# Patient Record
Sex: Male | Born: 1950 | Race: White | Hispanic: No | Marital: Married | Smoking: Never smoker
Health system: Southern US, Community
[De-identification: ages and names within clinical notes are randomized; demographics above are authoritative.]

## PROBLEM LIST (undated history)

## (undated) DIAGNOSIS — S149XXA Injury of unspecified nerves of neck, initial encounter: Secondary | ICD-10-CM

## (undated) DIAGNOSIS — M1990 Unspecified osteoarthritis, unspecified site: Secondary | ICD-10-CM

## (undated) DIAGNOSIS — I493 Ventricular premature depolarization: Secondary | ICD-10-CM

## (undated) DIAGNOSIS — C61 Malignant neoplasm of prostate: Secondary | ICD-10-CM

## (undated) DIAGNOSIS — E7800 Pure hypercholesterolemia, unspecified: Secondary | ICD-10-CM

## (undated) DIAGNOSIS — G4733 Obstructive sleep apnea (adult) (pediatric): Secondary | ICD-10-CM

## (undated) DIAGNOSIS — G589 Mononeuropathy, unspecified: Secondary | ICD-10-CM

## (undated) DIAGNOSIS — I6529 Occlusion and stenosis of unspecified carotid artery: Secondary | ICD-10-CM

## (undated) DIAGNOSIS — I2510 Atherosclerotic heart disease of native coronary artery without angina pectoris: Secondary | ICD-10-CM

## (undated) DIAGNOSIS — Z9289 Personal history of other medical treatment: Secondary | ICD-10-CM

## (undated) DIAGNOSIS — J302 Other seasonal allergic rhinitis: Secondary | ICD-10-CM

## (undated) HISTORY — PX: ANGIOPLASTY: SHX39

## (undated) HISTORY — DX: Other seasonal allergic rhinitis: J30.2

## (undated) HISTORY — DX: Ventricular premature depolarization: I49.3

## (undated) HISTORY — PX: CARDIAC CATHETERIZATION: SHX172

## (undated) HISTORY — DX: Personal history of other medical treatment: Z92.89

## (undated) HISTORY — DX: Obstructive sleep apnea (adult) (pediatric): G47.33

## (undated) HISTORY — DX: Occlusion and stenosis of unspecified carotid artery: I65.29

## (undated) HISTORY — DX: Malignant neoplasm of prostate: C61

---

## 2000-07-05 ENCOUNTER — Encounter: Payer: Self-pay | Admitting: Family Medicine

## 2000-07-05 ENCOUNTER — Encounter: Admission: RE | Admit: 2000-07-05 | Discharge: 2000-07-05 | Payer: Self-pay | Admitting: Family Medicine

## 2002-09-21 ENCOUNTER — Encounter (INDEPENDENT_AMBULATORY_CARE_PROVIDER_SITE_OTHER): Payer: Self-pay

## 2002-09-21 ENCOUNTER — Ambulatory Visit (HOSPITAL_COMMUNITY): Admission: RE | Admit: 2002-09-21 | Discharge: 2002-09-21 | Payer: Self-pay | Admitting: Gastroenterology

## 2003-04-07 ENCOUNTER — Encounter: Payer: Self-pay | Admitting: Cardiology

## 2003-04-07 ENCOUNTER — Inpatient Hospital Stay (HOSPITAL_COMMUNITY): Admission: EM | Admit: 2003-04-07 | Discharge: 2003-04-08 | Payer: Self-pay | Admitting: Emergency Medicine

## 2003-04-10 ENCOUNTER — Inpatient Hospital Stay (HOSPITAL_COMMUNITY): Admission: EM | Admit: 2003-04-10 | Discharge: 2003-04-11 | Payer: Self-pay | Admitting: Emergency Medicine

## 2004-01-11 ENCOUNTER — Encounter: Admission: RE | Admit: 2004-01-11 | Discharge: 2004-01-11 | Payer: Self-pay | Admitting: Cardiology

## 2004-01-12 ENCOUNTER — Inpatient Hospital Stay (HOSPITAL_COMMUNITY): Admission: EM | Admit: 2004-01-12 | Discharge: 2004-01-13 | Payer: Self-pay | Admitting: Emergency Medicine

## 2004-02-01 ENCOUNTER — Ambulatory Visit (HOSPITAL_COMMUNITY): Admission: RE | Admit: 2004-02-01 | Discharge: 2004-02-01 | Payer: Self-pay | Admitting: Cardiology

## 2004-05-29 ENCOUNTER — Encounter: Admission: RE | Admit: 2004-05-29 | Discharge: 2004-05-29 | Payer: Self-pay | Admitting: Family Medicine

## 2004-07-07 ENCOUNTER — Ambulatory Visit (HOSPITAL_COMMUNITY): Admission: RE | Admit: 2004-07-07 | Discharge: 2004-07-07 | Payer: Self-pay | Admitting: Urology

## 2004-07-12 ENCOUNTER — Ambulatory Visit (HOSPITAL_COMMUNITY): Admission: RE | Admit: 2004-07-12 | Discharge: 2004-07-12 | Payer: Self-pay | Admitting: Urology

## 2004-07-25 ENCOUNTER — Ambulatory Visit: Admission: RE | Admit: 2004-07-25 | Discharge: 2004-08-25 | Payer: Self-pay | Admitting: Radiation Oncology

## 2004-08-16 HISTORY — PX: PROSTATECTOMY: SHX69

## 2004-08-23 ENCOUNTER — Inpatient Hospital Stay (HOSPITAL_COMMUNITY): Admission: RE | Admit: 2004-08-23 | Discharge: 2004-08-25 | Payer: Self-pay | Admitting: Urology

## 2004-08-23 ENCOUNTER — Encounter (INDEPENDENT_AMBULATORY_CARE_PROVIDER_SITE_OTHER): Payer: Self-pay | Admitting: Specialist

## 2009-09-20 ENCOUNTER — Inpatient Hospital Stay (HOSPITAL_COMMUNITY): Admission: EM | Admit: 2009-09-20 | Discharge: 2009-09-22 | Payer: Self-pay | Admitting: Emergency Medicine

## 2010-10-08 LAB — POCT CARDIAC MARKERS
Myoglobin, poc: 147 ng/mL (ref 12–200)
Troponin i, poc: <0.05 ng/mL (ref 0.00–0.09)

## 2010-10-08 LAB — COMPREHENSIVE METABOLIC PANEL
Albumin: 4.1 g/dL (ref 3.5–5.2)
Alkaline Phosphatase: 58 U/L (ref 39–117)
BUN: 11 mg/dL (ref 6–23)
CO2: 27 mEq/L (ref 19–32)
Calcium: 9.2 mg/dL (ref 8.4–10.5)
Chloride: 102 mEq/L (ref 96–112)
GFR calc Af Amer: >60 mL/min (ref >60)
GFR calc non Af Amer: >60 mL/min (ref >60)
Glucose, Bld: 167 mg/dL — ABNORMAL HIGH (ref 70–99)
Sodium: 139 mEq/L (ref 135–145)
Total Bilirubin: 0.7 mg/dL (ref 0.3–1.2)

## 2010-10-08 LAB — CBC
HCT: 40.8 % (ref 39.0–52.0)
Hemoglobin: 13.8 g/dL (ref 13.0–17.0)
MCHC: 35.2 g/dL (ref 30.0–36.0)
MCHC: 35.3 g/dL (ref 30.0–36.0)
MCHC: 35.6 g/dL (ref 30.0–36.0)
MCV: 90.2 fL (ref 78.0–100.0)
MCV: 91.9 fL (ref 78.0–100.0)
Platelets: 227 10*3/uL (ref 150–400)
RBC: 4.27 MIL/uL (ref 4.22–5.81)
RDW: 13.4 % (ref 11.5–15.5)
WBC: 7.3 10*3/uL (ref 4.0–10.5)
WBC: 8.1 10*3/uL (ref 4.0–10.5)

## 2010-10-08 LAB — CARDIAC PANEL(CRET KIN+CKTOT+MB+TROPI)
CK, MB: 1.1 ng/mL (ref 0.3–4.0)
CK, MB: 1.5 ng/mL (ref 0.3–4.0)
Relative Index: 0.4 (ref 0.0–2.5)
Relative Index: 0.5 (ref 0.0–2.5)
Troponin I: 0.01 ng/mL (ref 0.00–0.06)

## 2010-10-08 LAB — PROTIME-INR
INR: 0.97 (ref 0.00–1.49)
Prothrombin Time: 13.1 seconds (ref 11.6–15.2)
Prothrombin Time: 13.1 seconds (ref 11.6–15.2)

## 2010-10-08 LAB — URINALYSIS, ROUTINE W REFLEX MICROSCOPIC
Bilirubin Urine: NEGATIVE
Hgb urine dipstick: NEGATIVE
Specific Gravity, Urine: 1.009 (ref 1.005–1.030)

## 2010-10-08 LAB — MAGNESIUM: Magnesium: 2.2 mg/dL (ref 1.5–2.5)

## 2010-10-08 LAB — BASIC METABOLIC PANEL
BUN: 12 mg/dL (ref 6–23)
BUN: 13 mg/dL (ref 6–23)
Calcium: 8.5 mg/dL (ref 8.4–10.5)
Calcium: 8.7 mg/dL (ref 8.4–10.5)
GFR calc Af Amer: >60 mL/min (ref >60)
GFR calc non Af Amer: >60 mL/min (ref >60)
Glucose, Bld: 102 mg/dL — ABNORMAL HIGH (ref 70–99)
Glucose, Bld: 106 mg/dL — ABNORMAL HIGH (ref 70–99)
Potassium: 3.9 mEq/L (ref 3.5–5.1)
Sodium: 133 mEq/L — ABNORMAL LOW (ref 135–145)
Sodium: 138 mEq/L (ref 135–145)

## 2010-10-08 LAB — POCT I-STAT, CHEM 8
BUN: 20 mg/dL (ref 6–23)
HCT: 42 % (ref 39.0–52.0)
Hemoglobin: 14.3 g/dL (ref 13.0–17.0)
Sodium: 137 mEq/L (ref 135–145)
TCO2: 27 mmol/L (ref 0–100)

## 2010-10-08 LAB — APTT
aPTT: 31 seconds (ref 24–37)
aPTT: 43 seconds — ABNORMAL HIGH (ref 24–37)

## 2010-10-08 LAB — DIFFERENTIAL
Basophils Absolute: 0 10*3/uL (ref 0.0–0.1)
Basophils Relative: 0 % (ref 0–1)
Eosinophils Relative: 1 % (ref 0–5)
Neutro Abs: 4.6 10*3/uL (ref 1.7–7.7)
Neutrophils Relative %: 64 % (ref 43–77)

## 2010-10-08 LAB — TROPONIN I: Troponin I: <0.01 ng/mL (ref 0.00–0.06)

## 2010-10-08 LAB — D-DIMER, QUANTITATIVE: D-Dimer, Quant: 0.49 ug/mL-FEU — ABNORMAL HIGH (ref 0.00–0.48)

## 2010-10-08 LAB — LIPID PANEL
HDL: 35 mg/dL — ABNORMAL LOW (ref >39)
LDL Cholesterol: UNDETERMINED mg/dL (ref 0–99)
Total CHOL/HDL Ratio: 4.8 RATIO
Triglycerides: 603 mg/dL — ABNORMAL HIGH (ref <150)
VLDL: UNDETERMINED mg/dL (ref 0–40)

## 2010-10-08 LAB — HEMOGLOBIN A1C: Mean Plasma Glucose: 126 mg/dL

## 2010-10-08 LAB — CK TOTAL AND CKMB (NOT AT ARMC): Total CK: 357 U/L — ABNORMAL HIGH (ref 7–232)

## 2010-10-08 LAB — HEPARIN LEVEL (UNFRACTIONATED): Heparin Unfractionated: 0.36 IU/mL (ref 0.30–0.70)

## 2010-12-01 NOTE — H&P (Signed)
NAME:  ELDER, DAVIDIAN NO.:  [id number]   MEDICAL RECORD NO.:  [id number]          PATIENT TYPE:  INP   LOCATION:  0374                         FACILITY:  [facility]   PHYSICIAN:  Lucrezia Starch. Earlene Plater, M.D.  DATE OF BIRTH:  1950-11-24   DATE OF ADMISSION:  08/23/2004  DATE OF DISCHARGE:  08/25/2004                                HISTORY & PHYSICAL   CHIEF COMPLAINT:  I have prostate cancer.   HISTORY OF PRESENT ILLNESS:  Mr. Warren is a pleasant 60 year old white  male, who presented with a PSA in the 4 range with a low free-to-total  ratio, and he underwent biopsy of the prostate which revealed a Gleason  score 7 which is 3 + 4 adenocarcinoma in 30% of the biopsy within the right  side of the prostate.  The metastatic work-up was negative.  After  discussing risks, benefits, and alternatives, elected to proceed with  radical retropubic prostatectomy.   ALLERGIES:  He has no known drug allergies.   MEDICATIONS:  He is currently on Ambien, fluoxetine, and aspirin.   PAST SURGICAL HISTORY:  He had an angioplasty in 2004 and then treated in  2005 with cardiac catheterization in July 2005.   PAST MEDICAL HISTORY:  1.  Significant for sleep apnea.  He uses a CPAP machine.  2.  He has had a previous cardiac catheterization.   SOCIAL HISTORY:  He is a negative smoker.  He has 2 drinks 3-4 times per  week.   FAMILY HISTORY:  Not significant.   REVIEW OF SYSTEMS:  He has no shortness of breath, dyspnea on exertion,  chest pain, or GI complaint.   PHYSICAL EXAMINATION:  VITAL SIGNS:  He is afebrile.  Vital signs stable.  GENERAL:  He is a well-nourished, well-developed, well-groomed.  HEENT:  Normal.  NECK:  Without masses or thyromegaly.  CHEST:  Clear posteriorly without rales or rhonchi.  HEART:  Normal sinus rhythm without murmurs, rubs, or gallops.  ABDOMEN:  Soft, nontender, without masses or organomegaly.  EXTREMITIES:  Normal.  NEUROLOGIC:  Intact.  SKIN:   Normal.  GU:  Penis, meatus, scrotum, testicle, adnexa, anus, and perineum are  normal.  RECTAL:  Prostate 25 g, slightly firm, but really no focal nodules are  noted.   IMPRESSION:  Prostate cancer.   PLAN:  Radical retropubic prostatectomy.      RLD/MEDQ  D:  09/10/2004  T:  09/10/2004  Job:  [id number]

## 2010-12-01 NOTE — Discharge Summary (Signed)
NAME:  [name], [name]                        ACCOUNT NO.:  [id number]   MEDICAL RECORD NO.:  [id number]                   PATIENT TYPE:  INP   LOCATION:  6533                                 FACILITY:  [facility]   PHYSICIAN:  Thereasa Solo. Little, M.D.              DATE OF BIRTH:  Aug 15, 1950   DATE OF ADMISSION:  04/07/2003  DATE OF DISCHARGE:  04/08/2003                                 DISCHARGE SUMMARY   HISTORY OF PRESENT ILLNESS/HOSPITAL COURSE:  Mr. Luis Kane is a 60-year-  old white male patient, who came into the hospital secondary to chest pain  symptoms.  He had been seen in the office by his primary care doctor, Mosetta Putt, M.D., and was referred to Gaspar Garbe B. Little, M.D. for further  evaluation.  He was seen by Cristy Hilts. Jacinto Halim, M.D.  It was decided that he  should be admitted, and he should undergo a cardiac catheterization.  This  was performed on April 07, 2003, by Dr. Jacinto Halim.  He was found to have a  lesion in his LAD, specifically a trifurcation lesion.  It was 99%.  He  underwent cutting balloon procedure to the LAD.  He had a residual blockage  of 70% in his RCA which was not intervened.  It was decided that he may need  an outpatient Cardiolite test to determine if the RCA lesion was causing any  ischemia.  On April 08, 2003, he was not feeling any chest pain.  He was  stable and discharged home.  He was given dietary and other therapeutic  lifestyle change behavior education, and his new medications were discussed.   LABORATORY DATA:  Hemoglobin 13.3, hematocrit 37.7, platelets 264, WBC 7.6.  Sodium 139, potassium 3.6, BUN 10, creatinine 1.1, glucose 104.  AST 30, ALT  36.  CK-MBs negative x 3.  Troponin was negative x 1.  Troponin on April 08, 2003, at 2:30 a.m. was 0.05.  He had a third CK-MB drawn which was  negative.  TSH was 1.287.  T3 uptake was 44.7.  There is no chest x-ray on  the chart at the time of this dictation.   DISCHARGE  MEDICATIONS:  1. Imdur 30 mg once daily.  2. Coreg 3.125 mg b.i.d.  3. Altace 2.5 mg once daily.  4. Niaspan 500 mg q.h.s.  He should take with a light snack, yogurt or     apple, etc.  Then in one month, he should increase to 2 q.h.s.  5. Enteric-coated aspirin 325 take 1/2 hour before his Niaspan.  6. Plavix 75 mg once daily.  7. Lipitor 20 mg once daily.  8. Protonix 40 mg once daily x 1 month.  9. Nitroglycerin sublingual p.r.n.   He should do no strenuous activity.  No pushing, pulling; no lifting x 4  days.  No driving x 3 days.  Low saturated fat diet.  Decrease his  simple  carbohydrates, etc.  If groin becomes swollen or bruised or tender or he  develops a fever, he will call our office.  He will wait one week before he  starts exercising and will see Gaspar Garbe B. Little, M.D. on October 5, at 3:15.  Dr. Clarene Duke, at that time, will decide about a Cardiolite test to assess the  RCA lesion.   DISCHARGE DIAGNOSES:  1. Unstable angina, status post cath with cutting balloon angioplasty to his     left anterior descending.  2.     Residual coronary disease with the right coronary artery 70%.  3. Dyslipidemia.  4. Metabolic syndrome.      Lezlie Octave, N.P.                        Thereasa Solo. Little, M.D.    BB/MEDQ  D:  05/05/2003  T:  05/05/2003  Job:  [id number]   cc:   Mosetta Putt, M.D.  [address] [place]  [place] [address]  Fax: [phone]

## 2010-12-01 NOTE — Op Note (Signed)
   NAME:  Luis Kane, Luis Kane                        ACCOUNT NO.:  [id number]   MEDICAL RECORD NO.:  [id number]                   PATIENT TYPE:  AMB   LOCATION:  ENDO                                 FACILITY:  [facility]   PHYSICIAN:  James L. Malon Kindle., M.D.          DATE OF BIRTH:  07-08-1951   DATE OF PROCEDURE:  09/21/2002  DATE OF DISCHARGE:                                 OPERATIVE REPORT   PROCEDURE:  Colonoscopy with polypectomy.   MEDICATIONS:  Fentanyl 75 mcg, Versed 7.5 mg IV.   SCOPE:  Olympus adjustable pediatric colonoscope.   INDICATIONS FOR PROCEDURE:  Colon cancer screening.   DESCRIPTION OF PROCEDURE:  The procedure had been explained to the patient  and consent obtained. With the patient in the left lateral decubitus  position, a digital exam was performed. The Olympus scope was inserted and  advanced under direct visualization. The prep was excellent. We were able to  reach the cecum without difficulty. The ileocecal valve and appendiceal  orifice were seen. The scope was withdrawn and the cecum, ascending colon,  hepatic flexure, transverse colon, splenic flexure, and descending colon  seen, no polyp seen. No significant diverticular disease. The sigmoid colon  was free of diverticular disease but at 30 cm from the anal verge, a 3/4 cm  sessile polyp was encountered and removed with the snare and sucked through  the scope. There was no bleeding. No other polyps were seen. There was no  significant diverticular disease in the sigmoid colon.  The scope was  withdrawn. The patient tolerated the procedure well and was maintained on  low flow oxygen and pulse oximeter throughout the procedure with no obvious  problem.   ASSESSMENT:  Sigmoid colon polyp removed. Snare polypectomy.   PLAN:  Routine post polypectomy instructions. Will check path and make  further recommendations depending on the results.                                               James L. Malon Kindle., M.D.    Waldron Session  D:  09/21/2002  T:  09/21/2002  Job:  [id number]   cc:   Mosetta Putt, M.D.  [address] [place]  [place] [address]  Fax: [phone]

## 2010-12-01 NOTE — Op Note (Signed)
NAME:  Luis Kane, Luis Kane NO.:  [id number]   MEDICAL RECORD NO.:  [id number]          PATIENT TYPE:  INP   LOCATION:  0008                         FACILITY:  [facility]   PHYSICIAN:  Lucrezia Starch. Earlene Plater, M.D.  DATE OF BIRTH:  Jan 27, 1951   DATE OF PROCEDURE:  08/23/2004  DATE OF DISCHARGE:                                 OPERATIVE REPORT   PREOPERATIVE DIAGNOSIS:  Adenocarcinoma of the prostate.   OPERATIVE PROCEDURE:  Radical retropubic prostatectomy and bilateral pelvic  lymphadenectomy.   SURGEON:  Lucrezia Starch. Earlene Plater, M.D.   ASSISTANT:  Vonita Moss and Elson Clan.   ANESTHESIA:  General endotracheal.   ESTIMATED BLOOD LOSS:  700 cc.   TUBES:  Large flat Blake drain.   COMPLICATIONS:  None.   INDICATION FOR PROCEDURE:  Luis Kane is a very nice 60 year old white male  who presented with an elevated PSA in the 4 range with a low free to total  ratio.  He subsequently underwent biopsy of the prostate which revealed a  Gleason score 7 which was 3+4 adenocarcinoma and 30% biopsy of the right  side of the prostate.  His metastatic workup was negative.  After presenting  risks, benefits, and alternatives, he has elected to undergo radical  retropubic prostatectomy.   PROCEDURE IN DETAIL:  The patient was placed in the supine position.  After  proper general endotracheal anesthesia, he was kept in the supine position  and prepped and draped with Betadine in sterile fashion.  A lower midline  vertical abdominal incision was made and sharp dissection was carried down  through subcutaneous tissue.  The anterior rectus sheath was incised in the  direction of the incision and the space of Retzius was entered.  Good  hemostasis was noted to be present.  A 24-French 30 cc Foley was placed into  the bladder and the bladder was drained.  Bilateral pelvic lymphadenectomy  was then performed after a Bookwalter retractor had been placed.  Both the  external iliac, obturator, and hypogastric  lymph nodes were removed,  carefully avoiding the lateral nodes.  The obturator nerves were identified  bilaterally and protected.  Dissection went from the ureter proximally to  the circumflex iliac vein distally and right and left sides.  The endopelvic  fascia was then perforated bilaterally and dissected.  The pudendal  perforators were clipped with small metal clips and cut.  The puboprostatic  ligaments were taken down sharply bilaterally.  The superficial dorsal vein  complex was cauterized.  Utilizing an Hoefeltner retractor, the dorsal vein  complex was encircled and ligated with two #1 Vicryl sutures.  A back-  bleeder stitch was placed at the bladder neck area with 2-0 chromic catgut  and the dorsal vein complex was incised sharply with Bovie coagulation  cautery.  The apex of the prostate was then approached.  An extra stitch was  placed with a 2-0 Vicryl UR-5 needle in the dorsal vein complex.  The  neurovascular bundles were dissected from the urethra bilaterally.  It might  be noted the lateral pelvic fascia had been dissected from the prostate  prior to proceeding with this to drop the neurovascular bundles  posterolaterally.  The urethra was then encircled with a right angle clamp.  The anterior urethra was incised sharply with a knife.  The catheter was  delivered into the field and the posterior urethra was dissected and sharply  incised.  The rectourethralis muscles were taken down from the apex of the  prostate.  The apex of the prostate was carefully lifted utilizing  Denonvilliers fascia from the rectum.  The lateral pedicles of the prostate  were then taken in several packets bilaterally, clipped with large right  angle hemoclips, and cut sharply.  The bladder neck was then approached.  The bladder neck was incised and maintained intact and dissected  posteriorly.  The ampulla of the vas deferens were clipped bilaterally, as  were the seminal vesicles.  Although the  tips of the seminal vesicles  remained in situ, they were somewhat scarred and it was felt that there was  a good chance that the neurovascular bundle would be damaged if we proceeded  with that, plus there appeared to be no tumor extending down to it.  The  specimen was then removed.  Irrigation was performed.  The mucosa was  plicated to the serosa of the bladder neck.  The serosa was plicated to the  adventitia of the bladder neck with interrupted 4-0 chromic catgut.  The  urethrovesical anastomosis was performed over a 22-French 10-cc balloon  Foley catheter.  A pull-out safety stitch was placed with a #1 Prolene  suture exiting from the bladder and sutured over a button.  Then 2-0 Vicryl  UR-5 needles were utilized to place stitches at the urethrovesical junction  in the 5 o'clock, 7 o'clock, 3 o'clock, 9 o'clock, and 12 o'clock position.  These were tied.  The bladder was irrigated clear.  Good hemostasis was  noted to be present.  A large flat Blake drain was placed through a left  stab incision and sutured in place with nylon.  Irrigation was performed.  Good hemostasis was noted to be present.  The rectus muscle bellies were  approximated in the midline with interrupted 2-0 chromic catgut.  The fascia  was closed with running #1 PDS suture.  The skin was closed with skin  staples, dressed sterilely, and the patient was taken to the recovery room  stable.      RLD/MEDQ  D:  08/23/2004  T:  08/23/2004  Job:  [id number]   cc:   Thereasa Solo. Little, M.D.  [address]  [address]  [place]  [place] [address]  Fax: [phone]   Mosetta Putt, M.D.  [address] [place]  [place] [address]  Fax: [phone]

## 2010-12-01 NOTE — Cardiovascular Report (Signed)
NAME:  Luis Kane, Luis Kane                        ACCOUNT NO.:  [id number]   MEDICAL RECORD NO.:  [id number]                   PATIENT TYPE:  INP   LOCATION:  6531                                 FACILITY:  [facility]   PHYSICIAN:  Cristy Hilts. Jacinto Halim, M.D.                  DATE OF BIRTH:  20-Mar-1951   DATE OF PROCEDURE:  01/12/2004  DATE OF DISCHARGE:                              CARDIAC CATHETERIZATION   REFERRING PHYSICIAN:  Mosetta Putt, M.D. and Dr. Julieanne Manson.   PROCEDURE PERFORMED:  1. Left ventriculography.  2. Selective right and left coronary angiography.  3. Cutting balloon angioplasty of the mid left anterior descending artery     with a 2.75 x 10 mm cutting balloon.  4. Intracoronary nitroglycerin administration.  5. Utilization of Angiomax for anticoagulation.   INDICATION:  Luis Kane is a 60 year old Caucasian male with a history of  coronary artery disease status post mid LAD, cutting balloon angioplasty  that was performed on April 07, 2003, for a high grade trifurcation  lesion in the mid LAD.  Had been doing well until about 2-3 months ago, has  noticed increasing fatigue.  Yesterday and the day before he was having  increasing chest discomfort and one episode of presyncope yesterday.  This  morning he woke up with severe chest pain at around 3 o'clock in the morning  and was admitted to [facility] through the emergency room for unstable  angina.  Given ongoing chest pain in the emergency room, he was brought  directly to the cardiac catheterization lab although his EKG was normal, for  evaluation of progression of coronary disease.   HEMODYNAMIC DATA:  The left ventricular pressure 131/2 with end-diastolic  pressure of 8 mmHg.  The aortic pressures were 135/80 with a mean of 103  mmHg.  There was no pressure gradient across the aortic valve.   ANGIOGRAPHIC DATA:  Left ventricle.  Left ventricular systolic function was  normal and the ejection fraction was  estimated at 60-65%.  There was no  significant mitral regurgitation.   Right coronary artery.  The right coronary artery is a large caliber vessel.  It has got mild luminal irregularity.  In the mid segment, the previously  noted 50% stenosis appears to be stable.  There was LOW FILLING IN THE RCA  WHICH IMPROVED AND THE FLOW BECAME BRISK WITH INTRACORONARY NITROGLYCERIN  ADMINISTRATION.   Left main coronary artery.  The left main coronary artery is a large caliber  vessel.  It is normal.   Circumflex coronary artery.  The circumflex coronary artery is a large  caliber vessel.  It gives origin to a small obtuse marginal-1 and continues  as an obtuse marginal-2 after giving origin to an AV groove branch.  They  have mild luminal irregularity.   Left anterior descending artery.  The left anterior descending artery is a  large caliber vessel.  It  gives origin to a small diagonal-1 which has  ostial 10-20% stenosis and a large diagonal-2.  Just at the origin of the  diagonal-2 there is a large septal perforator and the LAD at this  trifurcation point has a 99% stenosis.   Otherwise, the LAD has mild disease.   Right innominate and left subclavian with RIMA and LIMA visualization.  This  revealed widely patent LIMA and LIMA and subclavians and innominate.   INTERVENTION DATA:  Successful PTCA with a 2.75 x 10 mm cutting balloon into  the mid LAD with reduction of stenosis from 99% to less than 10%.  The TIMI  flow was from TIMI-3 to TIMI-3 flow at the end of the procedure with no  evidence of dissection or thrombus.   OVERALL IMPRESSIONS:  1. Progression of coronary disease with restenosis of the previous cutting     balloon angioplasty site; previously, a 2.5 mm cutting balloon was     utilized; presently, a 2.75 cm cutting balloon was utilized.  2. High complex lesion in the mid left anterior descending which is a     trifurcation lesion with a large septal perforator and a large  diagonal-     1.  During cutting balloon there was spasm of the ostium of the large     diagonal-1, hence stenting was not contemplated.  If patient continues to     have any recurrence, in that if he has restenosis in his left anterior     descending, consideration should be given for one vessel bypass surgery.  3. HIS BLOOD FLOW IMPROVED MARKEDLY IN THE RIGHT CORONARY ARTERY WITH     INTRACORONARY NITROGLYCERIN ADMINISTRATION SUGGESTING MICROVASCULAR     SPASM.  HENCE, PATIENT WILL ALSO BENEFIT FROM LONG-TERM THERAPY WITH     CALCIUM CHANNEL BLOCKER ALONG WITH LOW DOSE OF LONG-ACTING NITROGLYCERIN.  4. Aggressive risk factor modification is indicated with aggressive therapy     for lipids.  Try to maintain his HDL greater than 40 and the LDL less     than or equal to 70.  Again, weight loss and continued exercise is     indicated.   DIAGNOSTIC CARDIAC CATHETERIZATION:  Under usual sterile precautions using a  6-French right femoral artery access, a 6-French Multipurpose V2 catheter  was advanced into the ascending aorta with a 0.02 __________.  The catheter  was gently advanced to the left ventricle.  Left ventricular pressures were  monitored.  Hand contrast in the left ventricle was performed both in the  LAO and RAO positions.  Then the catheter was pulled back into the ascending  aorta and pressure gradient across the aortic valve was monitored.  Right  coronary artery selectively engaged and angiography was performed.  In a  similar fashion, the left coronary artery was selectively engaged and  angiography was performed.  Then the catheter was pulled into the arch of  the aorta and right innominate and left subclavian artery was selectively  cannulated, angiography was performed.  Then the catheter was pulled to the  body in the usual fashion and a 6 [place] Judkins __________ diagnostic  catheter was utilized to engage the left main coronary artery and angiography was repeated.   During the procedure, 200 mcg of intracoronary  nitroglycerin was also administered into the RCA.   TECHNIQUE INTERVENTION:  A 6 French sheath was exchanged with a 7 [place]  sheath.  Then a 7 [place] guide was utilized to engage the left  main  coronary artery, angiography was performed.  Then a 190 cm x 0.014 inch  [employer]ahi soft guide wire was utilized to cross through the LAD stenosis.  The  tip of the wire was carefully positioned in the distal LAD.  Then a 2.75 x  10 mm cutting balloon was utilized and multiple cuts, anywhere from 4 to 6  atmospheres pressure at 60 seconds multiple times was performed.  Then the  balloon was withdrawn, multiple intracoronary nitroglycerin were  administered at the dose of 100-200 mcg during the procedure.  Angiography  revealed excellent results.  There was spasm of the diagonal-2 branch during  the angioplasty which was relieved  with intracoronary nitroglycerin administration.  After confirming excellent  success with balloon angioplasty, the guide wires were withdrawn,  angiography was repeated, guide catheter disengaged and pulled out of the  body in the usual fashion.  The patient tolerated the procedure well.  No  immediate complications noted.                                               Cristy Hilts. Jacinto Halim, M.D.    Pilar Plate  D:  01/12/2004  T:  01/12/2004  Job:  [id number]   cc:   Mosetta Putt, M.D.  [address] [place]  [place] [address]  Fax: [phone]   Thereasa Solo. Little, M.D.  [address]  [address]  [place]  [place] [address]  Fax: [phone]

## 2010-12-01 NOTE — Discharge Summary (Signed)
NAME:  KOOPER, GODSHALL                        ACCOUNT NO.:  [id number]   MEDICAL RECORD NO.:  [id number]                   PATIENT TYPE:  INP   LOCATION:  6531                                 FACILITY:  [facility]   PHYSICIAN:  Cristy Hilts. Jacinto Halim, M.D.                  DATE OF BIRTH:  [birth year]   DATE OF ADMISSION:  01/12/2004  DATE OF DISCHARGE:  01/13/2004                                 DISCHARGE SUMMARY   ADDENDUM:  Please add sleep apnea on CPAP to both his admission and  discharge diagnoses.      Eber Hong, P.A.                 Cristy Hilts. Jacinto Halim, M.D.    WDJ/MEDQ  D:  01/13/2004  T:  01/13/2004  Job:  [id number]

## 2010-12-01 NOTE — Cardiovascular Report (Signed)
NAME:  Luis Kane, Luis Kane NO.:  [id number]   MEDICAL RECORD NO.:  [id number]                   PATIENT TYPE:  OIB   LOCATION:  2899                                 FACILITY:  [facility]   PHYSICIAN:  Thereasa Solo. Little, M.D.              DATE OF BIRTH:  28-Dec-1950   DATE OF PROCEDURE:  02/01/2004  DATE OF DISCHARGE:  02/01/2004                              CARDIAC CATHETERIZATION   PROCEDURES PERFORMED:  Cardiac catheterization.   CARDIOLOGIST:  Thereasa Solo. Little, M.D.   INDICATIONS FOR TESTS:  This 60 year old gentleman has known coronary  disease having had a cutting balloon to his distal LAD at a trifurcation  point April 07, 2003.  He presented January 12, 2004 for repeat cardiac  catheterization because of anginal equivalent pain and had restenosed the  same area.  This also was treated with a cutting balloon.  He also has a 50-  60% area of narrowing in his mid RCA and has had a nuclear study that showed  no inferior ischemia in this region.   The patient presented to the office complaining of being more fatigued,  lightheaded, out of sorts, and having mild discomfort very similar to what  he had had both on April 07, 2003 and January 12, 2004.  Because of this he  was sent from the office to the hospital for an outpatient cardiac  catheterization.   DESCRIPTION OF PROCEDURE:  After obtaining informed consent the patient was  prepped and draped in the usual sterile fashion exposing the right groin.  Following local anesthetic with 1% Xylocaine the Seldinger technique was  employed and a 5 [place] introducer sheath was placed into the right femoral  artery.  Left and right coronary arteriography and ventriculography in the  RAO projection were performed.   RESULTS:   HEMODYNAMIC MONITORING:  Central aortic pressure 130/76.  Left ventricular  pressure 130/8 with no aortic valve gradient noted on pullback.   VENTRICULOGRAPHIC DATA:  Ventriculography:   Ventriculography in the RAO  projection using 25 mL of contrast at 12 cc/second revealed normal left  ventricular systolic function with an ejection fraction greater than 60%.  Mild mitral valve prolapse without mitral regurgitation was also seen.  The  left ventricular end diastolic pressure was 16.   CORONARY ARTERIOGRAPHY:  On fluoroscopy no calcification was appreciated.   1. Left Main:  The left main is normal.   1. LAD:  The LAD extended down towards the apex of the heart.  The proximal     and midportion of the LAD were free of significant disease.  The first     diagonal was free of disease.  At the second diagonal the LAD appeared to     trifurcate into the ongoing LAD, a second diagonal and a septal     perforator.  Just distal to this area, which is a previous area that had  been treated with a cutting balloon on two different occasions, there was     less than a 25% area of narrowing.  There was brisk TIMI III flow down     the LAD; and, the septal perforator and the diagonal did not have     involvement.  The distal LAD was free of disease.  (Cutting balloon site     was patent.)   1. Circumflex:  The circumflex gave rise to a large OM #2 and a small OM #1     all of which were free of disease.   1. Right Coronary Artery:  The right coronary artery is a very large and     dominant vessel, about 4.5 mm in diameter.  It gave rise to a large PDA     and posterolateral vessel.  In the midportion of the RCA was an area of     50% narrowing.  There was streaming of the contrast medium ___________     this area of narrowing.  There was brisk TIMI III flow and there was     clearly no change from June 2005.   CONCLUSION:  1. Widely patent angioplasty site in the left anterior descending.  2. Fifty percent area of narrowing in the mid right coronary artery, which     is a very large vessel and dominant.  3. Normal left ventricular systolic function.   DISCUSSION:  At this  point I cannot explain his symptoms based on his  coronary anatomy.  I plan to discharge him to home later today and follow up  in the office tomorrow.                                               Thereasa Solo. Little, M.D.    ABL/MEDQ  D:  02/01/2004  T:  02/02/2004  Job:  [id number]   cc:   Mosetta Putt, M.D.  [address] [place]  [place] [address]  Fax: [phone]   Catheterization Laboratory

## 2010-12-01 NOTE — Discharge Summary (Signed)
NAME:  Luis Kane, Luis Kane                        ACCOUNT NO.:  [id number]   MEDICAL RECORD NO.:  [id number]                   PATIENT TYPE:  INP   LOCATION:  6531                                 FACILITY:  [facility]   PHYSICIAN:  Thereasa Solo. Little, M.D.              DATE OF BIRTH:  November 09, 1950   DATE OF ADMISSION:  01/12/2004  DATE OF DISCHARGE:  01/13/2004                                 DISCHARGE SUMMARY   ADMISSION DIAGNOSES:  1. Atypical chest pain.  2. Known coronary artery disease with prior percutaneous coronary     intervention and a cutting balloon to the left anterior descending in     September 2004.  3. Hyperlipidemia.  4. Morbid obesity with body mass index of 32.3.   DISCHARGE DIAGNOSES:  1. Atypical chest pain.  2. Known coronary artery disease with prior percutaneous coronary     intervention as a cutting balloon to the left anterior descending in     September 2004.  3. Hyperlipidemia.  4. Morbid obesity with body mass index of 32.3.   PROCEDURES:  Cardiac catheterization, January 12, 2004, with cutting balloon  PCI of the mid LAD stenosis at trifurcation.   BRIEF HISTORY:  The patient is a 60 year old white male medical patient of  Dr. Caprice Kluver, who was evaluated yesterday.  He complained of increasing  weakness after exercising, for about 3 weeks, and presyncopal episodes  yesterday.  He was scheduled for elective catheterization in the evening of  his admission and was having chest pressure and pain at 3 a.m. today.  He  presented to the emergency department at [facility] at 4:50 a.m.  and was started on IV nitroglycerin with partial relief of his discomfort.  He was seen by Dr. Jacinto Halim in the emergency room that a.m. and he was taken to  the catheterization laboratory at that time.  For further history and  physical, please see the dictated note.   MEDICATIONS:  On admission:  1. Aspirin 81 mg daily.  2. Lipitor 20 mg daily.  3. Nitroglycerin p.r.n.  4.  Xanax 0.25 mg p.r.n.   HOSPITAL COURSE:  The patient was taken to the catheterization laboratory  and underwent catheterization.  He was found to have a 40-50% mid RCA  stenosis which was unchanged from 2004 catheterization.  He had a 20%  stenosis of the first diagonal at the ostium, and he had a 99% stenosis of  the mid LAD where it trifurcates.  He underwent PCI and cutting balloon  angioplasty of the site and the stenosis is currently reduced to less than  10%.  He has no other coronary disease.  His ejection fraction was 60%.  Dr.  Jacinto Halim transferred the patient back to the floor and started him on an ACE  and verapamil for his coronary artery disease and coronary vasospasm.  He  also increased his Lipitor to 80 mg daily and  recommended Plavix for at  least 1 year given his unstable angina.  If he has a recurrence of his  discomfort or stenosis, he would be considered for percutaneous intervention  versus single vessel coronary artery bypass grafting.  By the a.m. of January 13, 2004, he was doing well.  The catheterization site was good.   DISCHARGE INSTRUCTIONS:  We planned to discharge the patient home on:  1. Aspirin 325 mg daily.  2. Plavix 75 mg daily.  3. Lipitor 80 mg daily.  4. Nitroglycerin p.r.n.  5. Altace 2.5 (new medicine).  6. Verapamil 120 mg (new medicine).  7. Imdur 30 mg daily (new medicine).   FOLLOWUP:  He will follow up with Dr. Clarene Duke in approximately 2 weeks and  that is being worked out at the time of dictation.   CONDITION ON DISCHARGE:  Stable.   DISCHARGE DIAGNOSES:  1. Angina with 40-50% stable mid right coronary artery lesion and 90% mid     left anterior descending stenosis at the trifurcation with percutaneous     transluminal coronary angioplasty cutting balloon of the left anterior     descending with a 99% stenosis reduced to a 10% stenosis.  2. Gastroesophageal reflux disease.  3. Dyslipidemia.  4. Morbid obesity.   CONDITION ON DISCHARGE:   Improving.   LABORATORY DATA:  The CKs were negative.  Electrolytes were normal, BUN is  12, creatinine is 1.1.  Calcium, albumin, protein, and LFTs are normal.  White count is 8.6, hemoglobin is 15.4, hematocrit is 43.7, platelets are  302,000.      Eber Hong, P.A.                 Thereasa Solo. Little, M.D.    WDJ/MEDQ  D:  01/13/2004  T:  01/13/2004  Job:  [id number]

## 2010-12-01 NOTE — Discharge Summary (Signed)
NAME:  Luis Kane, Luis Kane NO.:  [id number]   MEDICAL RECORD NO.:  [id number]          PATIENT TYPE:  INP   LOCATION:  0374                         FACILITY:  [facility]   PHYSICIAN:  Luis Kane, M.D.  DATE OF BIRTH:  03-20-1951   DATE OF ADMISSION:  08/23/2004  DATE OF DISCHARGE:  08/25/2004                                 DISCHARGE SUMMARY   ADMISSION DIAGNOSIS:  Adenocarcinoma of prostate.   DISCHARGE DIAGNOSIS:  Status post radical retropubic prostatectomy and  bilateral pelvic lymphadenopathy with a pathology result of prostatic  adenocarcinoma.  Gleason's score is 7 (4 + 3) involving both lobes of the  prostate, extensive PIN.  Surgical margins free of tumor, and seminal  vesicles free of tumor.  Pelvic lymph nodes without cancer.  Cancer staging  is pT2c, pNO, pMX.   BRIEF HISTORY AND PHYSICAL:  Luis Kane is a pleasant 60 year old white male  who presented with elevated PSA in the 4 range with a low free/total ratio.  He subsequently underwent biopsy of the prostate which revealed a Gleason's  score of 7 which was 3 + 4 adenocarcinoma and 30% biopsy of the right side  of the prostate.  His metastatic workup was negative.  After a long  discussion with Luis Kane, he elected to undergo radical retropubic  prostatectomy.   PAST MEDICAL HISTORY:  Significant for sleep apnea.  He uses a CPAP machine.  Previous cardiac cath and angioplasty by Dr. __________.   PAST SURGICAL HISTORY:  Angioplasty in September of 2004 and in June of  2005, and a cardiac cath in July of 2005.   ALLERGIES:  He is allergic to no medications.   HOSPITAL COURSE:  The patient was admitted to [facility] on  August 23, 2004 under the care of Luis Kane.  He electively  underwent radical retropubic prostatectomy.  He tolerated the procedure  well, transferring in stable condition to the recovery room.  The patient  has done well over the 48-hour admission.  Vital signs  have been stable.  He  tolerated fluids well, and advanced to a regular diet which he tolerated  well.  He has had bowel movements.  His JP drain was removed this morning  with minimal drainage.   LABORATORY STUDIES:  WBC is 8700, hemoglobin 10.5, hematocrit 30.7,  platelets 276,000.  Sodium 127, potassium 3.8, BUN 8.0, creatinine 0.9,  glucose 128.   CONDITION ON DISCHARGE:  Stable.   DISCHARGE MEDICATIONS:  Vicodin 1-2 q.4-6h. p.r.n. pain, Colace 100 mg daily  or b.i.d. p.r.n. for constipation.  He is to begin aspirin 81 mg daily.  He  may also resume his home meds of Ambien 10 mg q.h.s. and fluoxetine 20 mg  daily.   ACTIVITY:  He is to refrain from heavy lifting over 10 pounds.  No driving  or riding in the car for long distances.  He may take short walks daily.   DIET:  Diet should be as tolerated.   WOUND CARE:  Shower daily, and the patient is being instructed on use of  both leg  bag and bedside bag for Foley catheter.   FOLLOWUP:  Luis Kane will be seen by myself, Luis Kane, nurse  practitioner, next Thursday, August 31, 2004 for staple removal.      JML/MEDQ  D:  08/25/2004  T:  08/25/2004  Job:  [id number]

## 2010-12-01 NOTE — Cardiovascular Report (Signed)
NAME:  Luis Kane, Luis Kane                        ACCOUNT NO.:  [id number]   MEDICAL RECORD NO.:  [id number]                   PATIENT TYPE:  INP   LOCATION:  6533                                 FACILITY:  [facility]   PHYSICIAN:  Cristy Hilts. Jacinto Halim, M.D.                  DATE OF BIRTH:  10/02/1950   DATE OF PROCEDURE:  DATE OF DISCHARGE:                              CARDIAC CATHETERIZATION   REFERRING PHYSICIAN:  Mosetta Putt, M.D.   ATTENDING CARDIOLOGIST:  Thereasa Solo. Little, M.D.   PROCEDURE:  1. Left ventriculography.  2. Selective left and right coronary arteriography.  3. Cutting balloon arthroplasty of the complex trifurcation, mid-left     anterior descending stenosis with a 2.5 x 6 mm cutting balloon.  4. Intracoronary nitroglycerin administration.  5. Adjuvant Angiomax utilization for anticoagulation.   INDICATIONS:  Luis Kane is a 60 year old Caucasian male with morbid  obesity, hypertriglyceridemia who was admitted through the emergency room  with chest pain suggestive of unstable angina.  Given this, he was directly  brought to the cardiac catheterization lab to evaluate his coronary anatomy  with a high suspicion for coronary disease.   SURGEON:  Cristy Hilts. Jacinto Halim, M.D.   HEMODYNAMIC DATA:  The left ventricular pressure was 112/7 with end-  diastolic pressure of 16 mmHg.  The aortic pressure was 112/65 with a mean  of 86 mmHg.  There was no pressure gradient across the aortic valve heard.   ANGIOGRAPHIC DATA:  LEFT VENTRICLE:  Left ventricular systolic function was  normal and ejection fraction was estimated at 60-65%.  There was no mitral  regurgitation.   RIGHT CORONARY ARTERY:  The right coronary artery is a very large caliber  vessel measuring approximately 4.5 mm. It was smooth in caliber, except in  the mid-segment which had a 70%, at most 80% (probable 80%) stenosis with  post-stenotic dilatation and turbulent flow noted at that spot. Otherwise,  the vessel itself  was smooth.  It was a very large caliber vessel.   LEFT VENTRICLE:  Left ventricle is a large caliber vessel it is normal.   CIRCUMFLEX:  The circumflex was a large caliber vessel. It gives origin to a  large, obtuse marginal 1 and continues in the AV groove.  It is smooth with  no significant luminal irregularity.   LEFT ANTERIOR DESCENDING:  The left anterior descending artery is a very  caliber vessel.  It gives origin to a small sized diagonal 1 and a very  large diagonal 2.  Just at the origin of the diagonal 2, the LAD also gives  origin to a very large septal perforator and at this trifurcation point of  septal perforator, diagonal 2 and LAD; the LAD has a 99% stenosis.   INTERVENTIONAL DATA:  Successful cutting balloon angioplasty using a 2.5 x 6  mm cutting balloon. Multiple cuts were made with reduction of stenosis from  99% to 0% with TIMI III to TIMI III flow, maintained at the end of the  procedure.  There was no evidence of dissection, no evidence of thrombus  attendant to the procedure.   RECOMMENDATIONS:  1. Plavix probably indefinitely.  Add Niaspan for hypertriglyceridemia.     Beta-blockers and ACE inhibitors are indicated.  2. Outpatient Cardiolite versus PCI of the right coronary artery, to be     determined at a later date.  He will follow up with his cardiac enzymes.   TECHNIQUE AND PROCEDURE/DIAGNOSTIC CARDIAC CATHETERIZATION:  Under the usual  sterile precautions using 6 French right femoral artery access, a 6 [place]  multipurpose B2 catheter was advanced into the ascending aorta with 0.035  inch J-wire. The catheter was then carried to the left ventricle and left  ventricular pressures were monitored.  Hand contrast injection of left  ventricle was performed, both in the LAO and RAO projections. The catheter  was flushed with saline and pulled back into the ascending aorta and  pressure gradient across the aortic valve was monitored.  Right coronary  artery  was selectively engaged and angiography was performed.  The left main  coronary artery was selectively engaged and angiography was performed. Then  the cathter was pulled out of the body in the usual fashion.   TECHNIQUE FOR INTERVENTION:  A 6 French catheter was exchanged for a 7  [place] sheath.  Then a 7 [place] FR-4 guide was advanced into the ascending  aorta over the J-wire and the left main coronary artery was selectively  engaged.  A 190 cm x 0.014 inch ATW guide wire was utilized to cross into  the LAD and the tip of the wire was carefully positioned in the distal left  anterior descending artery.  Angiography was performed and 200 mcg of intra-  coronary nitroglycerin was administered and angiography was repeated.  Then  a similar sized Lege guide wire was utilized to engage the diagonal 2 branch  for side branch protection.  Then a 2.5 x 6 mm cutting balloon was advanced  into the left anterior descending artery and after multiple inflations  proximal to the stenosis, the stenosis was able to be crossed initially with  resistance, and then very smoothly.  Multiple cuts were made, anywhere from  2 atmosphere pressures all the way up to 8 atmosphere pressures, anywhere  from 30 seconds up to 90 seconds.  After multiple cuts the balloon was  deflated, put back into the guiding catheter.  Intracoronary nitroglycerin  was again administered and then angiography was prepared.  Excellent results  were noted.   After confirming the success, the guide wire and the ballon catheter was  pulled out of the body. Repeat angiography was performed. During the guide,  catheter was disengaged from the left main, pulled out of the body in the  usual fashion.  A total of 600 mg of __________ was administered, 15  inflated 300 mg in the cath lab.  The patient tolerated the procedure well.  No complications noted.                                                Cristy Hilts. Jacinto Halim, M.D.   Pilar Plate  D:   04/07/2003  T:  04/08/2003  Job:  [id number]

## 2010-12-01 NOTE — Discharge Summary (Signed)
NAME:  Luis Kane, Luis Kane NO.:  [id number]   MEDICAL RECORD NO.:  [id number]                   PATIENT TYPE:  INP   LOCATION:  2031                                 FACILITY:  [facility]   PHYSICIAN:  Thereasa Solo. Little, M.D.              DATE OF BIRTH:  11-15-1950   DATE OF ADMISSION:  04/10/2003  DATE OF DISCHARGE:  04/11/2003                                 DISCHARGE SUMMARY   Mr. Luis Kane is a 60 year old white married male patient of Dr. Clarene Duke  and Dr. Duaine Dredge who was admitted on 04/10/2003, because of chest pain.  He  had just been discharged from the hospital on 04/08/2003, after having  cardiac catheterization and PTCA cutting balloon angioplasty to his LAD.  He  did have residual disease to his RCA of 70 to 80%.  It was recommended that  he either have an elective catheterization for pecutaneous coronary  intervention of his RCA on an outpatient basis or Cardiolite first to  evaluate for ischemia.  He was to be seen by Dr. Clarene Duke in the office first,  and Dr. Clarene Duke was to decide.   Today he apparently called the MD on call because of on and off sharp pain  radiating to his chest.  It was different from his presenting anginal pain  prior to his catheterization.  He had these since the pecutaneous coronary  intervention, but today, admission date, it was persistent and lasted for  about 40 minutes.  He finally took some nitroglycerin with relief.   HOSPITAL COURSE:  In the emergency room, he had minimal on and off  discomfort lasting seconds.  He was seen by Dr. Domingo Sep.  It was decided to  admit him and rule out MI.  We did not put him on IV nitroglycerin or IV  heparin.  We did increase Imdur from 30 to 60 mg.  He was seen by Dr. Tresa Endo  on the morning of 04/11/2003.  His CK-MB and troponin were all negative.  His  other labs were all stable.  Thus, it was decided he could be discharged  home.   LABORATORY DATA:  Hemoglobin 13.8, hematocrit  39.3, WBC 8.2, platelets 323.  INR 0.9, PTT 34. Sodium 136, potassium 4.4, chloride 106, CO2 25, glucose  89, BUN 11, creatinine 1.1.  SGOT 32, SGPT 24.  CK-MB all negative x 4.  Troponin negative x 3.   Chest x-ray done this admission.   EKG showed sinus bradycardia with rate of 48.   DISCHARGE MEDICATIONS:  1. Plavix 75 mg once per day.  2. Enteric-coated aspirin 325 mg once per day.  3. Imdur 60 mg once per day.  4. Coreg 3.125 mg twice per day.  5. Altace 2.5 mg once per day.  6. Niaspan 500 mg at bedtime.  7. Lipitor 20 mg once per day.  8. Protonix 40 mg once per day.  9.  Nitroglycerin 1 under tongue every 5 minutes x 3 for chest pain.   ACTIVITY:  As tolerated.   DIET:  Low-fat, low-sugar diet.   FOLLOW UP:  Follow up with Dr. Clarene Duke as already planned.   DISCHARGE DIAGNOSES:  1. Atypical chest pain.  His chest pain since admission was completely     different from his presenting angina prior to cutting balloon     angioplasty.  CK-MB negative x 3, and no EKG changes at this time.  We     did increase his Imdur at this hospitalization from 30 to 60 mg.  2. Prior atherosclerotic cardiovascular disease, just discharged 04/08/2003,     after cutting balloon angioplasty to his left anterior descending artery     at trifurcation, a complex lesion.  He has residual disease in his right     coronary artery.  Decision pending intervention or not per Dr. Clarene Duke.  3. Dyslipidemia, now on combined therapy with Lipitor and Niaspan.  4. Sinus bradycardia.  He is on low-dose Coreg.  He seems to be tolerating     it okay.  His heart rate is 48.  He does have a history of sinus     bradycardia prior to any chronotropic medications.  5. History of esophageal stricture and spasm, now on Protonix.      Lezlie Octave, N.P.                        Thereasa Solo. Little, M.D.    BB/MEDQ  D:  04/11/2003  T:  04/11/2003  Job:  [id number]   cc:   Mosetta Putt, M.D.  [address] [place]  [place] [address]  Fax: [phone]

## 2011-05-21 ENCOUNTER — Encounter (HOSPITAL_COMMUNITY): Payer: Self-pay | Admitting: Emergency Medicine

## 2011-05-21 ENCOUNTER — Emergency Department (HOSPITAL_COMMUNITY)
Admission: EM | Admit: 2011-05-21 | Discharge: 2011-05-21 | Discharge status: Against Medical Advice | Payer: 59 | Attending: Emergency Medicine | Admitting: Emergency Medicine

## 2011-05-21 DIAGNOSIS — I2510 Atherosclerotic heart disease of native coronary artery without angina pectoris: Secondary | ICD-10-CM | POA: Insufficient documentation

## 2011-05-21 DIAGNOSIS — R002 Palpitations: Secondary | ICD-10-CM | POA: Insufficient documentation

## 2011-05-21 DIAGNOSIS — I10 Essential (primary) hypertension: Secondary | ICD-10-CM | POA: Diagnosis present

## 2011-05-21 DIAGNOSIS — I498 Other specified cardiac arrhythmias: Secondary | ICD-10-CM | POA: Diagnosis present

## 2011-05-21 DIAGNOSIS — I499 Cardiac arrhythmia, unspecified: Secondary | ICD-10-CM | POA: Insufficient documentation

## 2011-05-21 DIAGNOSIS — E7800 Pure hypercholesterolemia, unspecified: Secondary | ICD-10-CM | POA: Insufficient documentation

## 2011-05-21 HISTORY — DX: Atherosclerotic heart disease of native coronary artery without angina pectoris: I25.10

## 2011-05-21 HISTORY — DX: Pure hypercholesterolemia, unspecified: E78.00

## 2011-05-21 LAB — CK TOTAL AND CKMB (NOT AT ARMC): CK, MB: 3.9 ng/mL (ref 0.3–4.0)

## 2011-05-21 LAB — CBC
HCT: 45.2 % (ref 39.0–52.0)
Hemoglobin: 15.9 g/dL (ref 13.0–17.0)
MCH: 31.5 pg (ref 26.0–34.0)
MCHC: 35.2 g/dL (ref 30.0–36.0)
MCV: 89.7 fL (ref 78.0–100.0)

## 2011-05-21 LAB — MAGNESIUM: Magnesium: 2 mg/dL (ref 1.5–2.5)

## 2011-05-21 LAB — COMPREHENSIVE METABOLIC PANEL
ALT: 25 U/L (ref 0–53)
AST: 22 U/L (ref 0–37)
Albumin: 4.4 g/dL (ref 3.5–5.2)
Calcium: 9.6 mg/dL (ref 8.4–10.5)
Sodium: 139 mEq/L (ref 135–145)
Total Protein: 7.9 g/dL (ref 6.0–8.3)

## 2011-05-21 MED ORDER — LACTATED RINGERS IV SOLN
Freq: Once | INTRAVENOUS | Status: AC
  Administered 2011-05-21: 18:00:00 via INTRAVENOUS

## 2011-05-21 NOTE — ED Notes (Signed)
Family at bedside. 

## 2011-05-21 NOTE — ED Notes (Signed)
Patients ekg was already done in stretcher/triage.

## 2011-05-21 NOTE — ED Notes (Signed)
Pt sent here by GI doctor; pt to have routine colonoscopy today and was noted to be in bygyminy. Pt deneis complaint; pt with 22g IV right hand

## 2011-05-21 NOTE — ED Notes (Signed)
Pt had GI prep for colonoscopy and was noted to have a HR of 30 in bigeminey.  Denies CP, SOB, N/V.  HR now in the 60s.  Pt to go home.

## 2011-05-21 NOTE — H&P (Signed)
Luis Kane is an 60 y.o. male.   Chief Complaint: Abnormal Heart Rhythm HPI: Luis Kane is a 60 yo caucasian male with a history of CAD with cutting balloon to the LAD bifurcation in 2005, HLD.  He had a nuclear stress test in March of 2011 which was negative for ischemia with an EF of 64%.  He presented for a colonoscopy to and was found to be bradycardic.  EKG reveals ventricular bigemny.  The colonoscopy was cancelled. ROS below.  Meds: Imdur 30mg , 0.5 tablet daily, ASA 81mg  daily, Pravastatin 80mg  daily  Past Medical History  Diagnosis Date  . CAD (coronary artery disease)   . Hypercholesteremia     History reviewed. No pertinent past surgical history.  History reviewed. No pertinent family history. Social History:  reports that he has never smoked. He does not have any smokeless tobacco history on file. He reports that he drinks alcohol. He reports that he does not use illicit drugs.  Allergies: No Known Allergies  No current facility-administered medications on file as of 05/21/2011.   No current outpatient prescriptions on file as of 05/21/2011.    Results for orders placed during the hospital encounter of 05/21/11 (from the past 48 hour(s))  CBC     Status: Normal   Collection Time   05/21/11  3:53 PM      Component Value Range Comment   WBC 9.6  4.0 - 10.5 (K/uL)    RBC 5.04  4.22 - 5.81 (MIL/uL)    Hemoglobin 15.9  13.0 - 17.0 (g/dL)    HCT 16.1  09.6 - 04.5 (%)    MCV 89.7  78.0 - 100.0 (fL)    MCH 31.5  26.0 - 34.0 (pg)    MCHC 35.2  30.0 - 36.0 (g/dL)    RDW 40.9  81.1 - 91.4 (%)    Platelets 235  150 - 400 (K/uL)       No results found.  Review of Systems  Constitutional: Negative for fever and diaphoresis.  Respiratory: Negative for shortness of breath.   Cardiovascular: Negative for chest pain, palpitations and leg swelling.  Gastrointestinal: Negative for nausea and vomiting.  Neurological: Negative for dizziness and weakness.    Blood pressure  167/51, pulse 67, temperature 98 F (36.7 C), temperature source Oral, resp. rate 18, SpO2 100.00%. Physical Exam  Constitutional: He is oriented to person, place, and time. He appears well-developed and well-nourished. No distress.  HENT:  Head: Normocephalic and atraumatic.  Eyes: EOM are normal. Pupils are equal, round, and reactive to light. No scleral icterus.  Neck: Neck supple.  Cardiovascular: Regular rhythm and intact distal pulses.  Bradycardia present.  Exam reveals gallop.   No murmur heard. Pulses:      Radial pulses are 2+ on the right side, and 2+ on the left side.       Dorsalis pedis pulses are 2+ on the right side, and 2+ on the left side.  Respiratory: Effort normal and breath sounds normal. He has no wheezes.  GI: Soft. Bowel sounds are normal. There is no tenderness.  Lymphadenopathy:    He has no cervical adenopathy.  Neurological: He is alert and oriented to person, place, and time.  Skin: Skin is warm and dry.     Assessment/Plan Patient Active Hospital Problem List:  Ventricular bigeminy (05/21/2011)  HR in the 30's CAD (coronary artery disease) () Hypercholesteremia () HTN (hypertension) (05/21/2011)   Plan:  Checking CBC, CMP, Magnesium, orthostatic BP.  Kennesha Brewbaker W 05/21/2011, 5:29 PM

## 2011-05-21 NOTE — H&P (Addendum)
Pt. Seen and examined. Agree with the NP/PA-C note as written. Currently he is out of bigemeny. I think this is related to electrolyte abnormalities after massive diarrhea from a colon prep. He is asymptomatic and has no chest pain. I would give him 1L of lactated ringers now .Marland Kitchen He should have an outpatient monitor for 1 week. Follow-up with Al Little in our office in 2-3 weeks.

## 2011-05-21 NOTE — ED Notes (Signed)
Pt denies dizziness with orthostatic vs. Tolerated well. No distress or c/o.

## 2011-05-21 NOTE — ED Notes (Signed)
Patient is resting comfortably. 

## 2011-05-21 NOTE — ED Notes (Signed)
Assumed care of pt.  No distress noted.  Pt standing, waiting on paperwork.  No distress noted.  Family at bedside.  Updated on delay.

## 2011-05-22 ENCOUNTER — Other Ambulatory Visit: Payer: Self-pay | Admitting: Gastroenterology

## 2011-05-22 NOTE — ED Provider Notes (Signed)
Pt was never seen by me or my PA  Ward Givens, MD 05/22/11 [phone]

## 2011-05-22 NOTE — ED Provider Notes (Signed)
Patient assigned to room but not brought back for evaluation. Left AMA from waiting room, therefore, patient never seen by me.  Rodena Medin, PA 05/22/11 [phone]

## 2011-10-03 ENCOUNTER — Other Ambulatory Visit: Payer: Self-pay

## 2011-10-03 ENCOUNTER — Emergency Department (HOSPITAL_COMMUNITY)
Admission: EM | Admit: 2011-10-03 | Discharge: 2011-10-04 | Disposition: A | Discharge status: Home / Self Care | Payer: 59 | Attending: Emergency Medicine | Admitting: Emergency Medicine

## 2011-10-03 ENCOUNTER — Emergency Department (HOSPITAL_COMMUNITY): Payer: 59

## 2011-10-03 ENCOUNTER — Encounter (HOSPITAL_COMMUNITY): Payer: Self-pay | Admitting: *Deleted

## 2011-10-03 DIAGNOSIS — R001 Bradycardia, unspecified: Secondary | ICD-10-CM

## 2011-10-03 DIAGNOSIS — R0982 Postnasal drip: Secondary | ICD-10-CM | POA: Insufficient documentation

## 2011-10-03 DIAGNOSIS — E7800 Pure hypercholesterolemia, unspecified: Secondary | ICD-10-CM | POA: Insufficient documentation

## 2011-10-03 DIAGNOSIS — I498 Other specified cardiac arrhythmias: Secondary | ICD-10-CM | POA: Insufficient documentation

## 2011-10-03 DIAGNOSIS — J3489 Other specified disorders of nose and nasal sinuses: Secondary | ICD-10-CM | POA: Insufficient documentation

## 2011-10-03 DIAGNOSIS — Z7982 Long term (current) use of aspirin: Secondary | ICD-10-CM | POA: Insufficient documentation

## 2011-10-03 DIAGNOSIS — I2510 Atherosclerotic heart disease of native coronary artery without angina pectoris: Secondary | ICD-10-CM | POA: Insufficient documentation

## 2011-10-03 DIAGNOSIS — Z79899 Other long term (current) drug therapy: Secondary | ICD-10-CM | POA: Insufficient documentation

## 2011-10-03 LAB — POCT I-STAT, CHEM 8
BUN: 14 mg/dL (ref 6–23)
Calcium, Ion: 1.16 mmol/L (ref 1.12–1.32)
TCO2: 24 mmol/L (ref 0–100)

## 2011-10-03 LAB — CARDIAC PANEL(CRET KIN+CKTOT+MB+TROPI)
Relative Index: 2.3 (ref 0.0–2.5)
Total CK: 110 U/L (ref 7–232)
Troponin I: <0.3 ng/mL (ref <0.30)

## 2011-10-03 NOTE — ED Notes (Signed)
Delay explained to patient.  Resting comfortably, NAD

## 2011-10-03 NOTE — ED Notes (Signed)
Pt was at [facility] to get some medication and they checked his VS and found that he was bradycardic.  Pt states that he has not had any "heart symptoms" but has been feeling ill lately "coming down with the flu" and has been taking nyquil and meds like this to help him feel better.  No CP or sob.  Pt does state that he has not been sleeping well for the past 2 weeks and has had "flu like symptoms" and not feeling "up to par".

## 2011-10-03 NOTE — ED Notes (Signed)
Dr Fredricka Bonine at bedside to assess pt

## 2011-10-03 NOTE — ED Notes (Signed)
Patient resting.  Denies any complaints.  heartrate is normal at this time.  Patient encouraged to call for assistance as needed.

## 2011-10-04 MED ORDER — CHLORPHENIRAMINE-ACETAMINOPHEN 2-325 MG PO TABS: 2.0000 | ORAL_TABLET | ORAL | Status: DC | PRN

## 2011-10-04 NOTE — ED Provider Notes (Signed)
History     CSN: [id number]  Arrival date & time 10/03/11  1814   First MD Initiated Contact with Patient 10/03/11 2307      Chief Complaint  Patient presents with  . Bradycardia    (Consider location/radiation/quality/duration/timing/severity/associated sxs/prior treatment) HPI Comments: The patient is a 61 year old male with a history of coronary artery disease and previous bradycardia with bigeminy found in November of 2012 after the patient had been doing bowel prep for colonoscopy, arrived for the colonoscopy, and was found to be bradycardic in bigeminy. Patient was at that time evaluated by Dr. Rennis Golden of cardiology and found to have a negative workup acutely with resolution of bigeminy and normalization of heart rate. At that time symptoms were attributed to dehydration and electrolyte abnormality. The patient followed up with Dr. Clarene Duke of cardiology for the same without any serious findings. The patient reports that over the last 2 days he's had symptoms of nasal congestion, rhinorrhea, postnasal drip for which she has been taking pseudoephedrine-containing decongestant. Today when he was at [facility] getting medication, he had his heart rate checked which was found to be bradycardic and irregular. He has had no symptoms of chest pain, palpitations, lightheadedness, near syncope, syncope, dyspnea, nausea, vomiting, or diaphoresis. The patient only endorses symptoms of nasal congestion and rhinorrhea. He presented to the ED for evaluation of these incidental findings. On arrival in the emergency department, he was found to be bradycardic in his initial ECG shows bigeminy, and comparing this to his prior EKG from November of 2012 there are no significant changes and the 2 are remarkably similar. The patient is awake, alert, and oriented in no apparent distress. At the time of my evaluation, several hours after ED arrival, his heart rate has normalized to between 50 and 62 beats per minute in a  normal sinus rhythm with no further bigeminy. His blood pressures remained stable and he remains asymptomatic. Cardiac enzymes are negative x2. Electrolytes are normal. The patient has been rehydrated with IV fluids which he states he believes have corrected his problems.  The history is provided by the patient and the spouse.    Past Medical History  Diagnosis Date  . CAD (coronary artery disease)   . Hypercholesteremia     Past Surgical History  Procedure Date  . Prostatectomy   . Angioplasty     No family history on file.  History  Substance Use Topics  . Smoking status: Never Smoker   . Smokeless tobacco: Not on file  . Alcohol Use: Yes      Review of Systems  Constitutional: Negative for diaphoresis, activity change, appetite change and fatigue.  HENT: Negative for hearing loss, nosebleeds, congestion, rhinorrhea and sinus pressure.   Eyes: Negative for visual disturbance.  Respiratory: Negative for cough, chest tightness, shortness of breath and wheezing.   Cardiovascular: Negative for chest pain, palpitations and leg swelling.  Gastrointestinal: Negative for nausea, vomiting and abdominal pain.  Genitourinary: Negative.   Musculoskeletal: Negative.   Skin: Negative.   Neurological: Negative for dizziness, tremors, syncope, weakness, light-headedness, numbness and headaches.  Hematological: Does not bruise/bleed easily.  Psychiatric/Behavioral: Negative.     Allergies  Review of patient's allergies indicates no known allergies.  Home Medications   Current Outpatient Rx  Name Route Sig Dispense Refill  . ASPIRIN EC 81 MG PO TBEC Oral Take 81 mg by mouth at bedtime.      . ISOSORBIDE MONONITRATE ER 30 MG PO TB24 Oral Take 45 mg  by mouth daily.      Marland Kitchen PRAVASTATIN SODIUM 80 MG PO TABS Oral Take 80 mg by mouth at bedtime.      Marland Kitchen PSEUDOEPHEDRINE-IBUPROFEN 30-200 MG PO CAPS Oral Take 1 tablet by mouth daily as needed. For congestion      BP 138/88  Pulse 62   Temp(Src) 97.8 F (36.6 C) (Oral)  Resp 16  SpO2 98%  Physical Exam  Nursing note and vitals reviewed. Constitutional: He is oriented to person, place, and time. He appears well-developed and well-nourished. No distress.  HENT:  Head: Normocephalic and atraumatic.  Mouth/Throat: Oropharynx is clear and moist.  Eyes: EOM are normal. Pupils are equal, round, and reactive to light.  Neck: Normal range of motion. Neck supple. No JVD present. No tracheal deviation present.  Cardiovascular: Normal rate, regular rhythm, S1 normal, S2 normal, normal heart sounds and intact distal pulses.   No extrasystoles are present. PMI is not displaced.  Exam reveals no gallop and no friction rub.   No murmur heard. Pulmonary/Chest: Effort normal and breath sounds normal. No accessory muscle usage or stridor. Not tachypneic. No respiratory distress. He has no decreased breath sounds. He has no wheezes. He has no rhonchi. He has no rales. He exhibits no tenderness, no bony tenderness, no crepitus and no retraction.  Abdominal: Soft. Bowel sounds are normal. He exhibits no distension and no mass. There is no tenderness. There is no rebound and no guarding.  Musculoskeletal: Normal range of motion. He exhibits no edema and no tenderness.  Neurological: He is alert and oriented to person, place, and time. No cranial nerve deficit. He exhibits normal muscle tone.  Skin: Skin is warm and dry. No rash noted. He is not diaphoretic. No erythema. No pallor.  Psychiatric: He has a normal mood and affect. His behavior is normal. Judgment and thought content normal.    ED Course  Procedures (including critical care time)  Labs Reviewed  POCT I-STAT, CHEM 8 - Abnormal; Notable for the following:    Glucose, Bld 114 (*)    All other components within normal limits  CARDIAC PANEL(CRET KIN+CKTOT+MB+TROPI)  POCT I-STAT TROPONIN I  MAGNESIUM   Dg Chest 2 View  10/03/2011  *RADIOLOGY REPORT*  Clinical Data: Cough and sore  throat  CHEST - 2 VIEW  Comparison: 09/22/2009  Findings: The heart size and mediastinal contours are within normal limits.  Both lungs are clear.  The visualized skeletal structures are unremarkable.  IMPRESSION: Negative exam.  Original Report Authenticated By: Rosealee Albee, M.D.     No diagnosis found.    MDM  At this time, I believe that the patient has some in the bradycardia, but that his bigeminy in this instance was associated with the use of pseudoephedrine containing decongestants. His bigeminy has resolved and his heart rate is at this time normal. His blood pressure is normal and he is not orthostatic. Cardiac enzymes are negative x2 and his examination, history, and other findings do not suggest myocardial ischemia or infarction, electrolyte induced arrhythmia, or serious symptomatic arrhythmia. I've advised the patient to discontinue any further use of pseudoephedrine containing decongestants and to call Dr. Clarene Duke of cardiology for followup within the next week for further evaluation of bradycardia. The patient states his understanding of and agreement with the plan of care.        Felisa Bonier, MD 10/04/11 [phone]

## 2011-10-04 NOTE — Discharge Instructions (Signed)
Arrhythmia Categories Your heart is a muscle that works to pump blood through your body by regular contractions. The beating of your heart is controlled by a system of special pacemaker cells. These cells control the electrical activity of the heart. When the system controlling this regular beating is disturbed, a heart rhythm abnormality (arrhythmia) results. WHEN YOUR HEART SKIPS A BEAT One of the most common and least serious heart arrhythmias is called an ectopic or premature atrial heartbeat (PAC). This may be noticed as a small change in your regular pulse. A PAC originates from the top part (atrium) of the heart. Within the right atrium, the SA node is the area that normally controls the regularity of the heart. PACs occur in heart tissue outside of the SA node region. You may feel this as a skipped beat or heart flutter, especially if several occur in succession or occur frequently.  Another arrhythmia is ventricular premature complex (VCP or PVC). These extra beats start out in the bottom, more muscular chambers of the heart. In most cases a PVC is harmless. If there are underlying causes that are making the heart irritable such as an overactive thyroid or a prior heart attack PVCs may be of more concern. In a few cases, medications to control the heart rhythm may be prescribed. Things to try at home:  Cut down or avoid alcohol, tobacco and caffeine.   Get enough sleep.   Reduce stress.   Exercise more.  WHEN THE HEART BEATS TOO FAST Atrial tachycardia is a fast heart rate, which starts out in the atrium. It may last from minutes to much longer. Your heart may beat 140 to 240 times per minute instead of the normal 60 to 100.  Symptoms include a worried feeling (anxiety) and a sense that your heart is beating fast and hard.   You may be able to stop the fast rate by holding your breath or bearing down as if you were going to have a bowel movement.   This type of fast rate is usually not  dangerous.  Atrial fibrillation and atrial flutter are other fast rhythms that start in the atria. Both conditions keep the atria from filling with enough blood so the heart does not work well.  Symptoms include feeling lightheaded or faint.   These fast rates may be the result of heart damage or disease. Too much thyroid hormone may play a role.   There may be no clear cause or it may be from heart disease or damage.   Medication or a special electrical treatment (cardioversion) may be needed to get the heart beating normally.  Ventricular tachycardia is a fast heart rate that starts in the lower muscular chambers (ventricles) This is a serious disorder that requires treatment as soon as possible. You need someone else to get and use a small defibrillator.  Symptoms include collapse, chest pain, or being short of breath.   Treatment may include medication, procedures to improve blood flow to the heart, or an implantable cardiac defibrillator (ICD).  DIAGNOSIS   A cardiogram (EKG or ECG) will be done to see the arrhythmia, as well as lab tests to check the underlying cause.   If the extra beats or fast rate come and go, you may wear a Holter monitor that records your heart rate for a longer period of time.  HOME CARE INSTRUCTIONS  If your caregiver has given you a follow-up appointment, it is very important to keep that appointment. Not keeping the  appointment could result in a heart attack, permanent injury, pain, and disability. If there is any problem keeping the appointment, you must call back to this facility for assistance.  SEEK MEDICAL CARE IF:  You have irregular or fast heartbeats (palpitations).   You experience skipped beats.   You develop lightheadedness.   You have chest discomfort.   You develop shortness of breath.   You have more frequent episodes, if you are already being treated.  SEEK IMMEDIATE MEDICAL CARE IF:   You have severe chest pain, especially if the  pain is crushing or pressure-like and spreads to the arms, back, neck, or jaw, or if you have sweating, feeling sick to your stomach (nausea), or shortness of breath. THIS IS AN EMERGENCY. Do not wait to see if the pain will go away. Get medical help at once. Call 911 or 0 (operator). DO NOT drive yourself to the hospital.   You feel dizzy or faint.   You have episodes of previously documented atrial tachycardia that do not resolve with the techniques your caregiver has taught you.   Irregular or rapid heartbeats begin to occur more often than in the past, especially if they are associated with more pronounced symptoms or of longer duration.  Document Released: 07/02/2005 Document Revised: 06/21/2011 Document Reviewed: 11/14/2006 [facility] Patient Information 2012 [place], [place].Bradycardia Bradycardia is a term for a heart rate (pulse) that, in adults, is slower than 60 beats per minute. A normal rate is 60 to 100 beats per minute. A heart rate below 60 beats per minute may be normal for some adults with healthy hearts. If the rate is too slow, the heart may have trouble pumping the volume of blood the body needs. If the heart rate gets too low, blood flow to the brain may be decreased and may make you feel lightheaded, dizzy, or faint. The heart has a natural pacemaker in the top of the heart called the SA node (sinoatrial or sinus node). This pacemaker sends out regular electrical signals to the muscle of the heart, telling the heart muscle when to beat (contract). The electrical signal travels from the upper parts of the heart (atria) through the AV node (atrioventricular node), to the lower chambers of the heart (ventricles). The ventricles squeeze, pumping the blood from your heart to your lungs and to the rest of your body. CAUSES   Problem with the heart's electrical system.   Problem with the heart's natural pacemaker.   Heart disease, damage, or infection.   Medications.   Problems  with minerals and salts (electrolytes).  SYMPTOMS   Fainting (syncope).   Fatigue and weakness.   Shortness of breath (dyspnea).   Chest pain (angina).   Drowsiness.   Confusion.  DIAGNOSIS   An electrocardiogram (ECG) can help your caregiver determine the type of slow heart rate you have.   If the cause is not seen on an ECG, you may need to wear a heart monitor that records your heart rhythm for several hours or days.   Blood tests.  TREATMENT   Electrolyte supplements.   Medications.   Withholding medication which is causing a slow heart rate.   Pacemaker placement.  SEEK IMMEDIATE MEDICAL CARE IF:   You feel lightheaded or faint.   You develop an irregular heart rate.   You feel chest pain or have trouble breathing.  MAKE SURE YOU:   Understand these instructions.   Will watch your condition.   Will get help right away if  you are not doing well or get worse.  Document Released: 03/24/2002 Document Revised: 06/21/2011 Document Reviewed: 02/18/2008 [facility] Patient Information 2012 [place], [place].Cardiac Arrhythmia Your heart is a muscle that works to pump blood through your body by regular contractions. The beating of your heart is controlled by a system of special pacemaker cells. These cells control the electrical activity of the heart. When the system controlling this regular beating is disturbed, a heart rhythm abnormality (arrhythmia) results. WHEN YOUR HEART SKIPS A BEAT One of the most common and least serious heart arrhythmias is called an ectopic or premature atrial heartbeat (PAC). This may be noticed as a small change in your regular pulse. A PAC originates from the top part (atrium) of the heart. Within the right atrium, the SA node is the area that normally controls the regularity of the heart. PACs occur in heart tissue outside of the SA node region. You may feel this as a skipped beat or heart flutter, especially if several occur in succession or  occur frequently.  Another arrhythmia is ventricular premature complex (VCP or PVC). These extra beats start out in the bottom, more muscular chambers of the heart. In most cases a PVC is harmless. If there are underlying causes that are making the heart irritable such as an overactive thyroid or a prior heart attack PVCs may be of more concern. In a few cases, medications to control the heart rhythm may be prescribed. Things to try at home:  Cut down or avoid alcohol, tobacco and caffeine.   Get enough sleep.   Reduce stress.   Exercise more.  WHEN THE HEART BEATS TOO FAST Atrial tachycardia is a fast heart rate, which starts out in the atrium. It may last from minutes to much longer. Your heart may beat 140 to 240 times per minute instead of the normal 60 to 100.  Symptoms include a worried feeling (anxiety) and a sense that your heart is beating fast and hard.   You may be able to stop the fast rate by holding your breath or bearing down as if you were going to have a bowel movement.   This type of fast rate is usually not dangerous.  Atrial fibrillation and atrial flutter are other fast rhythms that start in the atria. Both conditions keep the atria from filling with enough blood so the heart does not work well.  Symptoms include feeling light-headed or faint.   These fast rates may be the result of heart damage or disease. Too much thyroid hormone may play a role.   There may be no clear cause or it may be from heart disease or damage.   Medication or a special electrical treatment (cardioversion) may be needed to get the heart beating normally.  Ventricular tachycardia is a fast heart rate that starts in the lower muscular chambers (ventricles) This is a serious disorder that requires treatment as soon as possible. You need someone else to get and use a small defibrillator.  Symptoms include collapse, chest pain, or being short of breath.   Treatment may include medication,  procedures to improve blood flow to the heart, or an implantable cardiac defibrillator (ICD).  DIAGNOSIS   A cardiogram (EKG or ECG) will be done to see the arrhythmia, as well as lab tests to check the underlying cause.   If the extra beats or fast rate come and go, you may wear a Holter monitor that records your heart rate for a longer period of time.  SEEK MEDICAL CARE IF:  You have irregular or fast heartbeats (palpitations).   You experience skipped beats.   You develop lightheadedness.   You have chest discomfort.   You have shortness of breath.   You have more frequent episodes, if you are already being treated.  SEEK IMMEDIATE MEDICAL CARE IF:   You have severe chest pain, especially if the pain is crushing or pressure-like and spreads to the arms, back, neck, or jaw, or if you have sweating, feeling sick to your stomach (nausea), or shortness of breath. THIS IS AN EMERGENCY. Do not wait to see if the pain will go away. Get medical help at once. Call 911 or 0 (operator). DO NOT drive yourself to the hospital.   You feel dizzy or faint.   You have episodes of previously documented atrial tachycardia that do not resolve with the techniques your caregiver has taught you.   Irregular or rapid heartbeats begin to occur more often than in the past, especially if they are associated with more pronounced symptoms or of longer duration.  Document Released: 07/02/2005 Document Revised: 06/21/2011 Document Reviewed: 02/18/2008 [facility] Patient Information 2012 [place], [place].Cardiac Arrhythmia Your heart is a muscle that works to pump blood through your body by regular contractions. The beating of your heart is controlled by a system of special pacemaker cells. These cells control the electrical activity of the heart. When the system controlling this regular beating is disturbed, a heart rhythm abnormality (arrhythmia) results. WHEN YOUR HEART SKIPS A BEAT One of the most common and  least serious heart arrhythmias is called an ectopic or premature atrial heartbeat (PAC). This may be noticed as a small change in your regular pulse. A PAC originates from the top part (atrium) of the heart. Within the right atrium, the SA node is the area that normally controls the regularity of the heart. PACs occur in heart tissue outside of the SA node region. You may feel this as a skipped beat or heart flutter, especially if several occur in succession or occur frequently.  Another arrhythmia is ventricular premature complex (VCP or PVC). These extra beats start out in the bottom, more muscular chambers of the heart. In most cases a PVC is harmless. If there are underlying causes that are making the heart irritable such as an overactive thyroid or a prior heart attack PVCs may be of more concern. In a few cases, medications to control the heart rhythm may be prescribed. Things to try at home:  Cut down or avoid alcohol, tobacco and caffeine.   Get enough sleep.   Reduce stress.   Exercise more.  WHEN THE HEART BEATS TOO FAST Atrial tachycardia is a fast heart rate, which starts out in the atrium. It may last from minutes to much longer. Your heart may beat 140 to 240 times per minute instead of the normal 60 to 100.  Symptoms include a worried feeling (anxiety) and a sense that your heart is beating fast and hard.   You may be able to stop the fast rate by holding your breath or bearing down as if you were going to have a bowel movement.   This type of fast rate is usually not dangerous.  Atrial fibrillation and atrial flutter are other fast rhythms that start in the atria. Both conditions keep the atria from filling with enough blood so the heart does not work well.  Symptoms include feeling light-headed or faint.   These fast rates may be the result  of heart damage or disease. Too much thyroid hormone may play a role.   There may be no clear cause or it may be from heart disease or  damage.   Medication or a special electrical treatment (cardioversion) may be needed to get the heart beating normally.  Ventricular tachycardia is a fast heart rate that starts in the lower muscular chambers (ventricles) This is a serious disorder that requires treatment as soon as possible. You need someone else to get and use a small defibrillator.  Symptoms include collapse, chest pain, or being short of breath.   Treatment may include medication, procedures to improve blood flow to the heart, or an implantable cardiac defibrillator (ICD).  DIAGNOSIS   A cardiogram (EKG or ECG) will be done to see the arrhythmia, as well as lab tests to check the underlying cause.   If the extra beats or fast rate come and go, you may wear a Holter monitor that records your heart rate for a longer period of time.  SEEK MEDICAL CARE IF:  You have irregular or fast heartbeats (palpitations).   You experience skipped beats.   You develop lightheadedness.   You have chest discomfort.   You have shortness of breath.   You have more frequent episodes, if you are already being treated.  SEEK IMMEDIATE MEDICAL CARE IF:   You have severe chest pain, especially if the pain is crushing or pressure-like and spreads to the arms, back, neck, or jaw, or if you have sweating, feeling sick to your stomach (nausea), or shortness of breath. THIS IS AN EMERGENCY. Do not wait to see if the pain will go away. Get medical help at once. Call 911 or 0 (operator). DO NOT drive yourself to the hospital.   You feel dizzy or faint.   You have episodes of previously documented atrial tachycardia that do not resolve with the techniques your caregiver has taught you.   Irregular or rapid heartbeats begin to occur more often than in the past, especially if they are associated with more pronounced symptoms or of longer duration.  Document Released: 07/02/2005 Document Revised: 06/21/2011 Document Reviewed: 02/18/2008 [facility]  Patient Information 2012 [place], [place].

## 2011-10-04 NOTE — ED Notes (Signed)
rx x 1, pt voiced understanding to f/u with PCP tomorrow about bradycardia.

## 2012-03-14 ENCOUNTER — Encounter (HOSPITAL_COMMUNITY)
Admission: RE | Disposition: A | Discharge status: Home / Self Care | Payer: Self-pay | Source: Ambulatory Visit | Attending: Gastroenterology

## 2012-03-14 ENCOUNTER — Ambulatory Visit (HOSPITAL_COMMUNITY)
Admission: RE | Admit: 2012-03-14 | Discharge: 2012-03-14 | Disposition: A | Discharge status: Home / Self Care | Payer: 59 | Source: Ambulatory Visit | Attending: Gastroenterology | Admitting: Gastroenterology

## 2012-03-14 ENCOUNTER — Encounter (HOSPITAL_COMMUNITY): Payer: Self-pay | Admitting: *Deleted

## 2012-03-14 DIAGNOSIS — Z9079 Acquired absence of other genital organ(s): Secondary | ICD-10-CM | POA: Insufficient documentation

## 2012-03-14 DIAGNOSIS — Z7982 Long term (current) use of aspirin: Secondary | ICD-10-CM | POA: Insufficient documentation

## 2012-03-14 DIAGNOSIS — E7800 Pure hypercholesterolemia, unspecified: Secondary | ICD-10-CM | POA: Insufficient documentation

## 2012-03-14 DIAGNOSIS — Z09 Encounter for follow-up examination after completed treatment for conditions other than malignant neoplasm: Secondary | ICD-10-CM | POA: Insufficient documentation

## 2012-03-14 DIAGNOSIS — I2510 Atherosclerotic heart disease of native coronary artery without angina pectoris: Secondary | ICD-10-CM | POA: Insufficient documentation

## 2012-03-14 DIAGNOSIS — I10 Essential (primary) hypertension: Secondary | ICD-10-CM | POA: Insufficient documentation

## 2012-03-14 HISTORY — PX: COLONOSCOPY: SHX5424

## 2012-03-14 SURGERY — COLONOSCOPY
Anesthesia: Moderate Sedation

## 2012-03-14 MED ORDER — FENTANYL CITRATE 0.05 MG/ML IJ SOLN
INTRAMUSCULAR | Status: AC
  Filled 2012-03-14: qty 4

## 2012-03-14 MED ORDER — FENTANYL CITRATE 0.05 MG/ML IJ SOLN
INTRAMUSCULAR | Status: DC | PRN
  Administered 2012-03-14 (×5): 25 ug via INTRAVENOUS

## 2012-03-14 MED ORDER — SODIUM CHLORIDE 0.9 % IV SOLN: Freq: Once | INTRAVENOUS | Status: DC

## 2012-03-14 MED ORDER — MIDAZOLAM HCL 10 MG/2ML IJ SOLN
INTRAMUSCULAR | Status: AC
  Filled 2012-03-14: qty 4

## 2012-03-14 MED ORDER — MIDAZOLAM HCL 5 MG/5ML IJ SOLN
INTRAMUSCULAR | Status: DC | PRN
  Administered 2012-03-14 (×3): 1 mg via INTRAVENOUS
  Administered 2012-03-14 (×2): 2 mg via INTRAVENOUS

## 2012-03-14 MED ORDER — SODIUM CHLORIDE 0.9 % IV SOLN: INTRAVENOUS | Status: DC

## 2012-03-14 NOTE — Op Note (Signed)
[facility] [address] [place], [address]   COLONOSCOPY PROCEDURE REPORT  PATIENT: Luis Kane, Luis Kane  MR#: [id number] BIRTHDATE: 19-Dec-1950 , 61  yrs. old GENDER: Male ENDOSCOPIST: Carman Ching, MD REFERRED BY:   Dr. Duaine Dredge PROCEDURE DATE:  03/14/2012 PROCEDURE:  Colonoscopy ASA CLASS:   class II INDICATIONS:  previous history of colon polyps MEDICATIONS:    fentanyl 125 mg, Versed 7 mg.  DESCRIPTION OF PROCEDURE: the Pentax pediatric colonoscope inserted and advanced after digital rectal exam. The prep was excellent. Using abdominal pressure and placing the patient in the supine position we were able to advance to the cecum. IC valve and appendiceal orifice identified. Scope withdrawn and cecum, descending colon, transverse colon, descending sigmoid and rectum were seen well. No polyps or other lesions seen. The retroflex of the rectum was normal. Patient tolerated the procedure well.     COMPLICATIONS: None  ENDOSCOPIC IMPRESSION:  History of colon polyps with negative colonoscopy this time  RECOMMENDATIONS: Will recommend resuming medications and repeating colonoscopy in 5 years.    _______________________________ Rosalie DoctorCarman Ching, MD 03/14/2012 2:00 PM   CC: Dr. Duaine Dredge

## 2012-03-14 NOTE — H&P (Signed)
  Subjective:   Patient is a 61 y.o. male presents with history of colon polyps for colonoscopy. Procedure including risks and benefits discussed in office.  Patient Active Problem List   Diagnosis Date Noted  . 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 HTN (hypertension) 05/21/2011  . CAD (coronary artery disease)   . Hypercholesteremia    Past Medical History  Diagnosis Date  . CAD (coronary artery disease)   . Hypercholesteremia     Past Surgical History  Procedure Date  . Prostatectomy   . Angioplasty     Prescriptions prior to admission  Medication Sig Dispense Refill  . aspirin EC 81 MG tablet Take 81 mg by mouth at bedtime.        Marland Kitchen atorvastatin (LIPITOR) 20 MG tablet Take 20 mg by mouth daily.      . Chlorpheniramine-APAP (CORICIDIN) 2-325 MG TABS Take 2 tablets by mouth every 4 (four) hours as needed (Nasal congestion).  20 each  0  . isosorbide mononitrate (IMDUR) 30 MG 24 hr tablet Take 45 mg by mouth daily.        . pravastatin (PRAVACHOL) 80 MG tablet Take 80 mg by mouth at bedtime.         No Known Allergies  History  Substance Use Topics  . Smoking status: Never Smoker   . Smokeless tobacco: Not on file  . Alcohol Use: Yes    History reviewed. No pertinent family history.   Objective:   Patient Vitals for the past 8 hrs:  BP Temp Temp src Pulse Resp SpO2 Height Weight  03/14/12 1228 127/77 mmHg 98.2 F (36.8 C) Oral 47  16  98 % 5' 10.5" (1.791 m) 102.513 kg (226 lb)         See MD Preop evaluation      Assessment:   Active Problems:  * No active hospital problems. *    Plan:   For colonoscopy today procedure discussed in the office.

## 2012-03-18 ENCOUNTER — Encounter (HOSPITAL_COMMUNITY): Payer: Self-pay

## 2012-03-18 ENCOUNTER — Encounter (HOSPITAL_COMMUNITY): Payer: Self-pay | Admitting: Gastroenterology

## 2013-02-05 ENCOUNTER — Ambulatory Visit (INDEPENDENT_AMBULATORY_CARE_PROVIDER_SITE_OTHER): Payer: 59 | Admitting: *Deleted

## 2013-02-05 ENCOUNTER — Other Ambulatory Visit (INDEPENDENT_AMBULATORY_CARE_PROVIDER_SITE_OTHER): Payer: 59 | Admitting: Vascular Surgery

## 2013-02-05 DIAGNOSIS — I4949 Other premature depolarization: Secondary | ICD-10-CM

## 2013-02-05 DIAGNOSIS — R0989 Other specified symptoms and signs involving the circulatory and respiratory systems: Secondary | ICD-10-CM

## 2013-02-05 DIAGNOSIS — I493 Ventricular premature depolarization: Secondary | ICD-10-CM

## 2013-02-06 ENCOUNTER — Other Ambulatory Visit: Payer: Self-pay | Admitting: *Deleted

## 2013-02-06 DIAGNOSIS — I493 Ventricular premature depolarization: Secondary | ICD-10-CM

## 2013-02-06 NOTE — Progress Notes (Signed)
24 hour Holter monitor given. Patient voiced understanding of verbal instructions, and agreed to bring the monitor back to the ofc by Monday for download.

## 2013-02-10 ENCOUNTER — Encounter: Payer: Self-pay | Admitting: Family Medicine

## 2013-05-22 ENCOUNTER — Encounter: Payer: Self-pay | Admitting: Pulmonary Disease

## 2013-05-22 ENCOUNTER — Ambulatory Visit (INDEPENDENT_AMBULATORY_CARE_PROVIDER_SITE_OTHER): Payer: 59 | Admitting: Pulmonary Disease

## 2013-05-22 VITALS — BP 134/86 | HR 50 | Temp 97.8°F | Ht 70.5 in | Wt 248.8 lb

## 2013-05-22 DIAGNOSIS — G4733 Obstructive sleep apnea (adult) (pediatric): Secondary | ICD-10-CM | POA: Insufficient documentation

## 2013-05-22 NOTE — Assessment & Plan Note (Signed)
The patient has a long history of obstructive sleep apnea which has responded very well to CPAP.  He is overdue for a new CPAP device, and will also take this opportunity to optimize his pressure on the automatic setting.  We'll also need a new mask as well as supplies.  I have encouraged him to work aggressively on weight loss, and would like to see him on a yearly basis.  He is to call me if he has any tolerance issues with his device or questions about his sleep apnea overall.

## 2013-05-22 NOTE — Progress Notes (Signed)
Subjective:    Patient ID: Luis Kane., male    DOB: [birth year], 62 y.o.   MRN: [id number]  HPI Patient is a 62 year old male who I've been asked to see her management of obstructive sleep apnea.  He was diagnosed in 1998 with severe obstructive sleep apnea by his history, and was started on CPAP at that time.  He has done extremely well with his CPAP device, and currently uses a nasal mask but not heated humidification.  He tells me that his CPAP device is 62 years old, and he believes that it is working properly.  The patient does not have frequent awakenings at night, and feels rested in the mornings upon arising.  He is satisfied with his alertness during the day, and reports no sleepiness with inactivity or driving.  He tells me that his weight is up about 40 pounds since his original study, and he has not had any pressure adjustment.  His Epworth score today is only 2.   Sleep Questionnaire What time do you typically go to bed?( Between what hours) 10-11030 10-11030 at 1043 on 05/22/13 by Maisie Fus, CMA How long does it take you to fall asleep? 5 - 10 mins 5 - 10 mins at 1043 on 05/22/13 by Maisie Fus, CMA How many times during the night do you wake up? 2 2 at 1043 on 05/22/13 by Maisie Fus, CMA What time do you get out of bed to start your day? 0700 0700 at 1043 on 05/22/13 by Maisie Fus, CMA Do you drive or operate heavy machinery in your occupation? No No at 1043 on 05/22/13 by Maisie Fus, CMA How much has your weight changed (up or down) over the past two years? (In pounds) 2 lb (0.907 kg)2 lb (0.907 kg) decrease at 1043 on 05/22/13 by Maisie Fus, CMA Have you ever had a sleep study before? Yes Yes at 1043 on 05/22/13 by Maisie Fus, CMA If yes, location of study? [facility] at 1043 on 05/22/13 by Maisie Fus, CMA If yes, date of study? 1998 1998 at 1043 on 05/22/13 by Maisie Fus, CMA Do you currently use CPAP? Yes Yes at 1043  on 05/22/13 by Maisie Fus, CMA If so, what pressure? 9 9 at 1043 on 05/22/13 by Maisie Fus, CMA Do you wear oxygen at any time? No No at 1043 on 05/22/13 by Maisie Fus, CMA   Review of Systems  Constitutional: Negative for fever and unexpected weight change.  HENT: Negative for congestion, dental problem, ear pain, nosebleeds, postnasal drip, rhinorrhea, sinus pressure, sneezing, sore throat and trouble swallowing.   Eyes: Negative for redness and itching.  Respiratory: Negative for cough, chest tightness, shortness of breath and wheezing.   Cardiovascular: Negative for palpitations and leg swelling.  Gastrointestinal: Negative for nausea and vomiting.  Genitourinary: Negative for dysuria.  Musculoskeletal: Negative for joint swelling.  Skin: Negative for rash.  Neurological: Positive for headaches.  Hematological: Does not bruise/bleed easily.  Psychiatric/Behavioral: Negative for dysphoric mood. The patient is not nervous/anxious.        Objective:   Physical Exam Constitutional:  Overweight male in nad  HENT:  Nares patent without discharge  Oropharynx without exudate, palate and uvula are thickened and elongated.   Eyes:  Perrla, eomi, no scleral icterus  Neck:  No JVD, no TMG  Cardiovascular:  Normal rate, regular rhythm, no rubs or gallops.  No murmurs  Intact distal pulses  Pulmonary :  Normal breath sounds, no stridor or respiratory distress   No rales, rhonchi, or wheezing  Abdominal:  Soft, nondistended, bowel sounds present.  No tenderness noted.   Musculoskeletal:  minimal lower extremity edema noted.  Lymph Nodes:  No cervical lymphadenopathy noted  Skin:  No cyanosis noted  Neurologic:  Alert, appropriate, moves all 4 extremities without obvious deficit.         Assessment & Plan:

## 2013-05-22 NOTE — Patient Instructions (Signed)
Will get you a new machine, and use the auto setting to optimize your pressure again. Work on weight loss followup with me in one year if doing well, and I will call once I receive your download.

## 2013-06-08 ENCOUNTER — Telehealth: Payer: Self-pay | Admitting: Pulmonary Disease

## 2013-06-08 NOTE — Telephone Encounter (Signed)
Sleep Studies from 1997 ant 1998 obtained and scanned into epic. These studies were faxed to [facility]  On 05/29/13. Pt is calling today stating that he still has not heard anything from [place].  Called and spoke with Okey Regal at [facility], who stated that "per [facility] representative, [facility] is requiring the face to face note from the original sleep study from 1997 ordered by Dr. Duaine Dredge. HIM retrieved chart from warehouse and found referral letter. Faxed this to [facility]. Waiting on response from [facility] at [place]. Rhonda J Cobb

## 2013-06-08 NOTE — Telephone Encounter (Signed)
Spoke with Okey Regal at [place] and per Okey Regal, she states that [facility] has closed pt's order b/c we didn't respond within the time period allow by [facility] of [place]. A new case will have to be started per Okey Regal. She suggests that the patient call [employer] as a policyholder and complain b/c all the patient is trying to obtain is a new mask. I advised the patient of the above and he wants me to contact [employer] and find out how much a mask cost and he will start this process over in January with a new insurance. Advised patient that I would speak with Okey Regal tomorrow morning. Rhonda J Cobb

## 2013-06-09 NOTE — Telephone Encounter (Signed)
Called and spoke with Okey Regal at [place] and she is going to contact patient and provide a mask and speak with him directly about the issue with [facility] and if it would be better to wait until the first of the year to proceed with getting the new machine. Okey Regal to call me back and advise as to what the patient decided to do. Rhonda J Cobb

## 2013-06-09 NOTE — Telephone Encounter (Signed)
Okey Regal with [facility] spoke with Mr. Gacek and is mailing him a mask (overnight) so he will have the mask tomorrow. After pt and Okey Regal talked, pt decided that he would place a hold on the order for the new cpap machine and will start the process once his new insurance goes into effect in January.  Nothing else needed at this time on this end. Okey Regal with [facility] will place a reminder in her computer to contact patient after the first of the year to proceed with processing the new cpap order. Rhonda J Cobb

## 2013-07-27 ENCOUNTER — Ambulatory Visit
Admission: RE | Admit: 2013-07-27 | Discharge: 2013-07-27 | Disposition: A | Discharge status: Home / Self Care | Payer: BC Managed Care – PPO | Source: Ambulatory Visit | Attending: Family Medicine | Admitting: Family Medicine

## 2013-07-27 ENCOUNTER — Other Ambulatory Visit: Payer: Self-pay | Admitting: Family Medicine

## 2013-07-27 DIAGNOSIS — M79672 Pain in left foot: Secondary | ICD-10-CM

## 2014-04-01 ENCOUNTER — Other Ambulatory Visit: Payer: Self-pay | Admitting: Family Medicine

## 2014-04-01 ENCOUNTER — Other Ambulatory Visit: Payer: Self-pay | Admitting: *Deleted

## 2014-04-01 DIAGNOSIS — I493 Ventricular premature depolarization: Secondary | ICD-10-CM

## 2014-04-01 DIAGNOSIS — E049 Nontoxic goiter, unspecified: Secondary | ICD-10-CM

## 2014-04-05 ENCOUNTER — Ambulatory Visit
Admission: RE | Admit: 2014-04-05 | Discharge: 2014-04-05 | Disposition: A | Discharge status: Home / Self Care | Payer: BC Managed Care – PPO | Source: Ambulatory Visit | Attending: Family Medicine | Admitting: Family Medicine

## 2014-04-05 DIAGNOSIS — E049 Nontoxic goiter, unspecified: Secondary | ICD-10-CM

## 2014-04-07 ENCOUNTER — Other Ambulatory Visit: Payer: Self-pay | Admitting: Family Medicine

## 2014-04-07 DIAGNOSIS — E042 Nontoxic multinodular goiter: Secondary | ICD-10-CM

## 2014-04-13 ENCOUNTER — Encounter: Payer: Self-pay | Admitting: Radiology

## 2014-04-13 ENCOUNTER — Encounter (INDEPENDENT_AMBULATORY_CARE_PROVIDER_SITE_OTHER): Payer: BC Managed Care – PPO

## 2014-04-13 DIAGNOSIS — I493 Ventricular premature depolarization: Secondary | ICD-10-CM

## 2014-04-13 DIAGNOSIS — I4949 Other premature depolarization: Secondary | ICD-10-CM

## 2014-04-13 NOTE — Progress Notes (Signed)
Patient ID: Luis Shorter., male   DOB: [birth year], 63 y.o.   MRN: [id number] E cardio 24hr holter applied

## 2014-04-14 ENCOUNTER — Ambulatory Visit
Admission: RE | Admit: 2014-04-14 | Discharge: 2014-04-14 | Disposition: A | Discharge status: Home / Self Care | Payer: BC Managed Care – PPO | Source: Ambulatory Visit | Attending: Family Medicine | Admitting: Family Medicine

## 2014-04-14 ENCOUNTER — Other Ambulatory Visit (HOSPITAL_COMMUNITY)
Admission: RE | Admit: 2014-04-14 | Discharge: 2014-04-14 | Disposition: A | Discharge status: Home / Self Care | Payer: BC Managed Care – PPO | Source: Ambulatory Visit | Attending: Interventional Radiology | Admitting: Interventional Radiology

## 2014-04-14 DIAGNOSIS — E041 Nontoxic single thyroid nodule: Secondary | ICD-10-CM | POA: Diagnosis present

## 2014-04-14 DIAGNOSIS — E042 Nontoxic multinodular goiter: Secondary | ICD-10-CM

## 2014-05-28 ENCOUNTER — Ambulatory Visit (INDEPENDENT_AMBULATORY_CARE_PROVIDER_SITE_OTHER): Payer: BC Managed Care – PPO | Admitting: Pulmonary Disease

## 2014-05-28 ENCOUNTER — Encounter: Payer: Self-pay | Admitting: Pulmonary Disease

## 2014-05-28 VITALS — BP 118/62 | HR 54 | Temp 98.5°F | Ht 70.5 in | Wt 249.0 lb

## 2014-05-28 DIAGNOSIS — G4733 Obstructive sleep apnea (adult) (pediatric): Secondary | ICD-10-CM

## 2014-05-28 NOTE — Assessment & Plan Note (Signed)
The patient is doing very well with C Pap, and is pleased with his sleep and daytime alertness overall. He is having no mask or pressure issues, and his download shows excellent compliance. It also shows rate control of his AHI, and no significant mask leaks. I have asked the patient to continue on his device, and to keep up with his mask changes and supplies. I also encouraged him to keep working on weight loss.

## 2014-05-28 NOTE — Progress Notes (Signed)
   Subjective:    Patient ID: Luis Shorter., male    DOB: 05/22/1951, 63 y.o.   MRN: [id number]  HPI The patient comes in today for follow-up of his obstructive sleep apnea. He is wearing C Pap compliantly, and is having no issues with his mask fit or pressure. His download shows excellent compliance, and good control of his AHI.   Review of Systems  Constitutional: Negative for fever and unexpected weight change.  HENT: Negative for congestion, dental problem, ear pain, nosebleeds, postnasal drip, rhinorrhea, sinus pressure, sneezing, sore throat and trouble swallowing.   Eyes: Negative for redness and itching.  Respiratory: Negative for cough, chest tightness, shortness of breath and wheezing.   Cardiovascular: Negative for palpitations and leg swelling.  Gastrointestinal: Negative for nausea and vomiting.  Genitourinary: Negative for dysuria.  Musculoskeletal: Negative for joint swelling.  Skin: Negative for rash.  Neurological: Negative for headaches.  Hematological: Does not bruise/bleed easily.  Psychiatric/Behavioral: Negative for dysphoric mood. The patient is not nervous/anxious.        Objective:   Physical Exam Overweight male in no acute distress Nose without purulence or discharge noted No skin breakdown or pressure necrosis from the C Pap mask Neck without lymphadenopathy or thyromegaly Lower extremities with minimal edema, no cyanosis Alert, does not appear to be sleepy, moves all 4 extremities.       Assessment & Plan:

## 2014-05-28 NOTE — Patient Instructions (Signed)
Continue on cpap, and keep up with mask changes and supplies. Keep working on weight loss Follow-up with me again in one year, but call if having issues.

## 2014-06-02 ENCOUNTER — Encounter (INDEPENDENT_AMBULATORY_CARE_PROVIDER_SITE_OTHER): Payer: Self-pay

## 2014-06-02 ENCOUNTER — Ambulatory Visit (INDEPENDENT_AMBULATORY_CARE_PROVIDER_SITE_OTHER): Payer: Self-pay | Admitting: Surgery

## 2014-07-13 ENCOUNTER — Encounter: Payer: Self-pay | Admitting: *Deleted

## 2014-07-19 ENCOUNTER — Encounter: Payer: Self-pay | Admitting: Internal Medicine

## 2014-07-19 ENCOUNTER — Ambulatory Visit (INDEPENDENT_AMBULATORY_CARE_PROVIDER_SITE_OTHER): Payer: BLUE CROSS/BLUE SHIELD | Admitting: Internal Medicine

## 2014-07-19 VITALS — BP 120/70 | HR 55 | Ht 70.5 in | Wt 246.6 lb

## 2014-07-19 DIAGNOSIS — I498 Other specified cardiac arrhythmias: Secondary | ICD-10-CM

## 2014-07-19 DIAGNOSIS — I2510 Atherosclerotic heart disease of native coronary artery without angina pectoris: Secondary | ICD-10-CM

## 2014-07-19 DIAGNOSIS — E7800 Pure hypercholesterolemia, unspecified: Secondary | ICD-10-CM

## 2014-07-19 DIAGNOSIS — I493 Ventricular premature depolarization: Secondary | ICD-10-CM

## 2014-07-19 DIAGNOSIS — I499 Cardiac arrhythmia, unspecified: Secondary | ICD-10-CM

## 2014-07-19 DIAGNOSIS — Z01810 Encounter for preprocedural cardiovascular examination: Secondary | ICD-10-CM

## 2014-07-19 NOTE — Patient Instructions (Signed)
Your physician has requested that you have an echocardiogram. Echocardiography is a painless test that uses sound waves to create images of your heart. It provides your doctor with information about the size and shape of your heart and how well your heart's chambers and valves are working. This procedure takes approximately one hour. There are no restrictions for this procedure.  Your physician recommends that you continue on your current medications as directed. Please refer to the Current Medication list given to you today.  Your physician wants you to follow-up in: 1 year with Dr. Rayann Heman. You will receive a reminder letter in the mail two months in advance. If you don't receive a letter, please call our office to schedule the follow-up appointment.

## 2014-07-20 ENCOUNTER — Encounter: Payer: Self-pay | Admitting: Internal Medicine

## 2014-07-20 DIAGNOSIS — Z01810 Encounter for preprocedural cardiovascular examination: Secondary | ICD-10-CM | POA: Insufficient documentation

## 2014-07-20 NOTE — Progress Notes (Signed)
Primary Care Physician: Marylene Land, MD Referring Physician:  Dr Waldon Reining. is a 64 y.o. male with a h/o CAD s/p PTCA, OSA on CPAP, and PVCs who presents to establish in the cardiology clinic and for surgical preoperative clearance.  He has a h/o CAD and also PVCs.  He was previously followed by Dr Rex Kras but has not seen him in several years.  He is very active.  He does significant yard work and enjoys trail biking and traveling.  His PVCs have improved with metoprolol.  He has been asymptomatic with his PVCs.  Per Dr Sandi Mariscal, he had a holter monitor last year which revealed 30,000 PVCs.  After increasing toprol to 50mg  qam, his PVC burden has decreased to 4,469.  He has developed thyroid disease and is planning to have thyroidectomy soon.  Today, he denies symptoms of palpitations, chest pain, shortness of breath, orthopnea, PND, lower extremity edema,  presyncope, syncope, or neurologic sequela.  He has occasional postural dizziness.  He also has dry eyes.  He has sleep apnea and uses CPAP.  He is followed by Dr Gwenette Greet for this.  The patient is tolerating medications without difficulties and is otherwise without complaint today.   Past Medical History  Diagnosis Date  . CAD (coronary artery disease)   . Hypercholesteremia   . Seasonal allergies   . Chronic headaches   . Prostate cancer     History of: removed 08/2004  . Premature ventricular contraction   . Carotid stenosis     nonobstructive  . OSA (obstructive sleep apnea)     uses CPAP   Past Surgical History  Procedure Laterality Date  . Prostatectomy  08/2004  . Angioplasty  2004, 2005    s/p PTCA  . Colonoscopy  03/14/2012    Procedure: COLONOSCOPY;  Surgeon: Winfield Cunas., MD;  Location: [facility];  Service: Endoscopy;  Laterality: N/A;    Current Outpatient Prescriptions  Medication Sig Dispense Refill  . aspirin EC 81 MG tablet Take 81 mg by mouth at bedtime.      Marland Kitchen atorvastatin  (LIPITOR) 40 MG tablet Take 1 tablet by mouth daily.  2  . Chlorpheniramine-APAP (CORICIDIN) 2-325 MG TABS Take 2 tablets by mouth every 4 (four) hours as needed (Nasal congestion). 20 each 0  . cholecalciferol (VITAMIN D) 1000 UNITS tablet Take 1,000 Units by mouth daily.    . cycloSPORINE (RESTASIS) 0.05 % ophthalmic emulsion 1 drop 2 (two) times daily.    . Melatonin-Pyridoxine (MELATONIN TR) 5-10 MG TBCR Take 1 tablet by mouth at bedtime as needed (sleep).     . metoprolol succinate (TOPROL-XL) 50 MG 24 hr tablet Take 50 mg by mouth every morning.  11  . Multiple Vitamin (MULTIVITAMIN) tablet Take 1 tablet by mouth daily.     No current facility-administered medications for this visit.    No Known Allergies  History   Social History  . Marital Status: Married    Spouse Name: N/A    Number of Children: N/A  . Years of Education: N/A   Occupational History  . VP Sales and [employer]   Social History Main Topics  . Smoking status: Never Smoker   . Smokeless tobacco: Not on file  . Alcohol Use: Yes     Comment: 1-2 drinks +/-  . Drug Use: No  . Sexual Activity: Not on file   Other Topics Concern  . Not on file  Social History Narrative   Lives in [place] and is employed by [employer].  Married and lives with his spouse.    Family History  Problem Relation Age of Onset  . Allergies Father   . Prostate cancer Father   . Prostate cancer Brother     ROS- All systems are reviewed and negative except as per the HPI above  Physical Exam: Filed Vitals:   07/19/14 0927  BP: 120/70  Pulse: 55  Height: 5' 10.5" (1.791 m)  Weight: 246 lb 9.6 oz (111.857 kg)    GEN- The patient is well appearing, alert and oriented x 3 today.   Head- normocephalic, atraumatic Eyes-  Sclera clear, conjunctiva pink Ears- hearing intact Oropharynx- clear Neck- supple, no JVP Lymph- no cervical lymphadenopathy Lungs- Clear to ausculation bilaterally, normal work  of breathing Heart- Regular rate and rhythm with ectopy, no murmurs, rubs or gallops, PMI not laterally displaced GI- soft, NT, ND, + BS Extremities- no clubbing, cyanosis, or edema MS- no significant deformity or atrophy Skin- no rash or lesion Psych- euthymic mood, full affect Neuro- strength and sensation are intact  EKG today reveals sinus bradycardia 55 bpm, PR 202, QRS 110, QTc 424, PVCs are observed in trigeminy.  This reveals a LBB inferior axis. Stress test 2013 was normal  Assessment and Plan:  1. Preoperative clearance The patient is very active and is able to achieve adequate METs activity without any ischemic symptoms.  I would therefore anticipate that he should be able to proceed with surgery with low risks.  I will obtain an echo at this time (see below).  If his Echo is normal then I would not recommend any additional preoperative testing and he can proceed with surgery.  He should continue his beta blocker perioperatively.  2. 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  These are benign and he is asymptomatic. I will obtain an echo at this time.  If his EF remains preserved then no further testing/ management is required  3. OSA The importance of compliance with CPAP is advised  4. CAD No ischemic symptoms As above  5. HL Followed by Dr Sandi Mariscal No changes today  Return to see me in 1 year Ok to proceed with surgery if echo is low risk.

## 2014-07-21 ENCOUNTER — Other Ambulatory Visit (HOSPITAL_COMMUNITY): Payer: Self-pay

## 2014-07-21 ENCOUNTER — Ambulatory Visit (HOSPITAL_COMMUNITY): Payer: BLUE CROSS/BLUE SHIELD | Attending: Internal Medicine

## 2014-07-21 DIAGNOSIS — I493 Ventricular premature depolarization: Secondary | ICD-10-CM | POA: Insufficient documentation

## 2014-07-21 DIAGNOSIS — R079 Chest pain, unspecified: Secondary | ICD-10-CM

## 2014-07-21 NOTE — Progress Notes (Signed)
2D Echo completed. 07/21/2014 

## 2014-07-22 ENCOUNTER — Other Ambulatory Visit (HOSPITAL_COMMUNITY): Payer: BC Managed Care – PPO

## 2014-08-04 ENCOUNTER — Telehealth: Payer: Self-pay | Admitting: Internal Medicine

## 2014-08-04 NOTE — Telephone Encounter (Signed)
New Message      Cindy from Dr. Tera Helper office needs a surgical clearance and can not read the echo and need a note of clearance.  Please and Fax and call.  Thanks  [employer] [phone]

## 2014-08-04 NOTE — Telephone Encounter (Signed)
Per Dr. Rayann Heman okay to proceed with surgery if indicated

## 2014-09-21 ENCOUNTER — Ambulatory Visit
Admission: RE | Admit: 2014-09-21 | Discharge: 2014-09-21 | Disposition: A | Discharge status: Home / Self Care | Payer: BLUE CROSS/BLUE SHIELD | Source: Ambulatory Visit | Attending: Family Medicine | Admitting: Family Medicine

## 2014-09-21 ENCOUNTER — Other Ambulatory Visit: Payer: Self-pay | Admitting: Family Medicine

## 2014-09-21 DIAGNOSIS — M542 Cervicalgia: Secondary | ICD-10-CM

## 2014-10-04 NOTE — Progress Notes (Signed)
Please put orders in Epic surgery 10-18-14 pre op 10-12-14 Thanks

## 2014-10-05 ENCOUNTER — Ambulatory Visit: Payer: Self-pay | Admitting: Surgery

## 2014-10-07 NOTE — Patient Instructions (Signed)
Luis Kane.  10/07/2014   Your procedure is scheduled on: 10/18/2014    Report to [facility] Main  Entrance and follow signs to               Graf at     [place].  Call this number if you have problems the morning of surgery [phone]   Remember:  Do not eat food or drink liquids :After Midnight.     Take these medicines the morning of surgery with A SIP OF WATER: Restasis eye drops, Toprol                                 You may not have any metal on your body including hair pins and              piercings  Do not wear jewelry,  lotions, powders or perfumes., deodorant.             .              Men may shave face and neck.   Do not bring valuables to the hospital. [place].  Contacts, dentures or bridgework may not be worn into surgery.  Leave suitcase in the car. After surgery it may be brought to your room.       Special Instructions:coughing and deep breathing exercises, leg exercises               Please read over the following fact sheets you were given: _____________________________________________________________________             [facility] - Preparing for Surgery Before surgery, you can play an important role.  Because skin is not sterile, your skin needs to be as free of germs as possible.  You can reduce the number of germs on your skin by washing with CHG (chlorahexidine gluconate) soap before surgery.  CHG is an antiseptic cleaner which kills germs and bonds with the skin to continue killing germs even after washing. Please DO NOT use if you have an allergy to CHG or antibacterial soaps.  If your skin becomes reddened/irritated stop using the CHG and inform your nurse when you arrive at Short Stay. Do not shave (including legs and underarms) for at least 48 hours prior to the first CHG shower.  You may shave your face/neck. Please follow these instructions  carefully:  1.  Shower with CHG Soap the night before surgery and the  morning of Surgery.  2.  If you choose to wash your hair, wash your hair first as usual with your  normal  shampoo.  3.  After you shampoo, rinse your hair and body thoroughly to remove the  shampoo.                           4.  Use CHG as you would any other liquid soap.  You can apply chg directly  to the skin and wash                       Gently with a scrungie or clean washcloth.  5.  Apply the CHG Soap to your body  ONLY FROM THE NECK DOWN.   Do not use on face/ open                           Wound or open sores. Avoid contact with eyes, ears mouth and genitals (private parts).                       Wash face,  Genitals (private parts) with your normal soap.             6.  Wash thoroughly, paying special attention to the area where your surgery  will be performed.  7.  Thoroughly rinse your body with warm water from the neck down.  8.  DO NOT shower/wash with your normal soap after using and rinsing off  the CHG Soap.                9.  Pat yourself dry with a clean towel.            10.  Wear clean pajamas.            11.  Place clean sheets on your bed the night of your first shower and do not  sleep with pets. Day of Surgery : Do not apply any lotions/deodorants the morning of surgery.  Please wear clean clothes to the hospital/surgery center.  FAILURE TO FOLLOW THESE INSTRUCTIONS MAY RESULT IN THE CANCELLATION OF YOUR SURGERY PATIENT SIGNATURE_________________________________  NURSE SIGNATURE__________________________________  ________________________________________________________________________

## 2014-10-12 ENCOUNTER — Encounter (HOSPITAL_COMMUNITY)
Admission: RE | Admit: 2014-10-12 | Discharge: 2014-10-12 | Disposition: A | Discharge status: Home / Self Care | Payer: BLUE CROSS/BLUE SHIELD | Source: Ambulatory Visit | Attending: Surgery | Admitting: Surgery

## 2014-10-12 ENCOUNTER — Encounter (HOSPITAL_COMMUNITY): Payer: Self-pay

## 2014-10-12 ENCOUNTER — Ambulatory Visit (HOSPITAL_COMMUNITY)
Admission: RE | Admit: 2014-10-12 | Discharge: 2014-10-12 | Disposition: A | Discharge status: Home / Self Care | Payer: BLUE CROSS/BLUE SHIELD | Source: Ambulatory Visit | Attending: Anesthesiology | Admitting: Anesthesiology

## 2014-10-12 DIAGNOSIS — Z01818 Encounter for other preprocedural examination: Secondary | ICD-10-CM | POA: Diagnosis not present

## 2014-10-12 HISTORY — DX: Mononeuropathy, unspecified: G58.9

## 2014-10-12 HISTORY — DX: Injury of unspecified nerves of neck, initial encounter: S14.9XXA

## 2014-10-12 HISTORY — DX: Unspecified osteoarthritis, unspecified site: M19.90

## 2014-10-12 LAB — BASIC METABOLIC PANEL
Anion gap: 9 (ref 5–15)
BUN: 22 mg/dL (ref 6–23)
CO2: 28 mmol/L (ref 19–32)
CREATININE: 1.05 mg/dL (ref 0.50–1.35)
Calcium: 9.5 mg/dL (ref 8.4–10.5)
Chloride: 101 mmol/L (ref 96–112)
GFR calc Af Amer: 85 mL/min — ABNORMAL LOW (ref >90)
GFR calc non Af Amer: 74 mL/min — ABNORMAL LOW (ref >90)
Glucose, Bld: 126 mg/dL — ABNORMAL HIGH (ref 70–99)
Potassium: 4.5 mmol/L (ref 3.5–5.1)
Sodium: 138 mmol/L (ref 135–145)

## 2014-10-12 LAB — CBC
HCT: 43.1 % (ref 39.0–52.0)
HEMOGLOBIN: 14.5 g/dL (ref 13.0–17.0)
MCH: 31.4 pg (ref 26.0–34.0)
MCHC: 33.6 g/dL (ref 30.0–36.0)
MCV: 93.3 fL (ref 78.0–100.0)
PLATELETS: 224 10*3/uL (ref 150–400)
RBC: 4.62 MIL/uL (ref 4.22–5.81)
RDW: 13.3 % (ref 11.5–15.5)
WBC: 8.6 10*3/uL (ref 4.0–10.5)

## 2014-10-12 NOTE — Progress Notes (Addendum)
EKG- 07/19/2014 EPIC  ECHO- 07/21/2014 EPIC   LOV with Dr Thompson Grayer- 07/19/2014 EPIC  05/28/2014 LOV- Dr Gwenette Greet- EPIC  Clearance note 07/2014 in EPIC  Holter monitor 04/13/2014 in [facility]

## 2014-10-12 NOTE — Progress Notes (Signed)
Quick Note:  These results are acceptable for scheduled surgery.  Renesmay Nesbitt M. Berdell Hostetler, MD, FACS [facility] Office: [phone]   ______ 

## 2014-10-12 NOTE — Progress Notes (Signed)
Quick Note:  Pre-operative chest x-ray is acceptable for scheduled surgery.  Previn Jian M. Rynn Markiewicz, MD, FACS [facility] Office: [phone]   ______ 

## 2014-10-12 NOTE — Progress Notes (Signed)
Patient reports pinched nerve in neck affecting left arm being followed by Dr Sandi Mariscal. Patient states Dr Harlow Asa aware.

## 2014-10-12 NOTE — Progress Notes (Signed)
Quick Note:  These results are acceptable for scheduled surgery.  Sofia Jaquith M. Zillah Alexie, MD, FACS [facility] Office: [phone]   ______ 

## 2014-10-16 ENCOUNTER — Encounter (HOSPITAL_COMMUNITY): Payer: Self-pay | Admitting: Surgery

## 2014-10-16 DIAGNOSIS — D440 Neoplasm of uncertain behavior of thyroid gland: Secondary | ICD-10-CM | POA: Diagnosis present

## 2014-10-16 NOTE — H&P (Signed)
General Surgery [facility]  Luis Kane Real DOB: [birth year] Married / Language: English / Race: White Male  History of Present Illness:  The patient is a 64 year old male who presents with a thyroid nodule. Patient is referred by Dr. Derinda Late for evaluation of thyroid neoplasm of uncertain behavior. Patient was noted on physical exam by his primary care physician to have a palpable mass in the thyroid. Patient subsequently underwent thyroid ultrasound which demonstrated a mildly enlarged thyroid gland with a dominant nodule in the right lobe measuring 3.1 cm and a dominant nodule in the left lobe measuring 3.8 cm. 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 There were nuclear changes. There were calcifications. Patient has a history of radiation exposure as a child. This was performed in [facility] in [place] for thyroid goiter and asthma. Patient has never been on thyroid medication. He has had no prior history of neck surgery. There is no family history of thyroid disease. There is no family history of other endocrine neoplasm.   Other Problems  Asthma Gastroesophageal Reflux Disease Migraine Headache Other disease, cancer, significant illness Prostate Cancer Sleep Apnea Thyroid Disease Vascular Disease  Past Surgical History  Colon Polyp Removal - Colonoscopy Prostate Surgery - Removal Vasectomy  Diagnostic Studies History  Colonoscopy 1-5 years ago  Allergies No Known Drug Allergies  Medication History  Atorvastatin Calcium (Oral) Specific dose unknown - Active. Metoprolol Succinate ER (Oral) Specific dose unknown - Active. Restasis (Ophthalmic) Specific dose unknown - Active. Aspirin (Oral) Specific dose unknown - Active. Vitamin D (Cholecalciferol) (Oral) Specific dose unknown - Active. Melatonin-Pyridoxine (Oral) Specific dose unknown -  Active. Multi Vitamin Daily (Oral) Specific dose unknown - Active.  Social History  Alcohol use Moderate alcohol use. No caffeine use No drug use Tobacco use Never smoker.  Family History Alcohol Abuse Father. Depression Father. Melanoma Father. Migraine Headache Mother. Prostate Cancer Brother, Father. Respiratory Condition Father. Seizure disorder Daughter.  Review of Systems  General Not Present- Appetite Loss, Chills, Fatigue, Fever, Night Sweats, Weight Gain and Weight Loss. Skin Not Present- Change in Wart/Mole, Dryness, Hives, Jaundice, New Lesions, Non-Healing Wounds, Rash and Ulcer. HEENT Present- Ringing in the Ears. Not Present- Earache, Hearing Loss, Hoarseness, Nose Bleed, Oral Ulcers, Seasonal Allergies, Sinus Pain, Sore Throat, Visual Disturbances, Wears glasses/contact lenses and Yellow Eyes. Respiratory Not Present- Bloody sputum, Chronic Cough, Difficulty Breathing, Snoring and Wheezing. Breast Not Present- Breast Mass, Breast Pain, Nipple Discharge and Skin Changes. Cardiovascular Not Present- Chest Pain, Difficulty Breathing Lying Down, Leg Cramps, Palpitations, Rapid Heart Rate, Shortness of Breath and Swelling of Extremities. Gastrointestinal Not Present- Abdominal Pain, Bloating, Bloody Stool, Change in Bowel Habits, Chronic diarrhea, Constipation, Difficulty Swallowing, Excessive gas, Gets full quickly at meals, Hemorrhoids, Indigestion, Nausea, Rectal Pain and Vomiting. Male Genitourinary Present- Impotence. Not Present- Blood in Urine, Change in Urinary Stream, Frequency, Nocturia, Painful Urination, Urgency and Urine Leakage. Musculoskeletal Present- Joint Pain and Muscle Weakness. Not Present- Back Pain, Joint Stiffness, Muscle Pain and Swelling of Extremities. Neurological Present- Tingling. Not Present- Decreased Memory, Fainting, Headaches, Numbness, Seizures, Tremor, Trouble walking and Weakness. Psychiatric Not Present- Anxiety, Bipolar,  Change in Sleep Pattern, Depression, Fearful and Frequent crying. Endocrine Not Present- Cold Intolerance, Excessive Hunger, Hair Changes, Heat Intolerance, Hot flashes and New Diabetes. Hematology Not Present- Easy Bruising, Excessive bleeding, Gland problems, HIV and Persistent Infections.   Vitals  Weight: 244 lb Height: 70.5in Body Surface Area:  2.35 m Body Mass Index: 34.52 kg/m Temp.: 98.5F(Oral)  Pulse: 48 (Regular)  Resp.: 16 (Unlabored)  BP: 138/78 (Sitting, Left Arm, Standard)    Physical Exam  General - appears comfortable, no distress; not diaphorectic  HEENT - normocephalic; sclerae clear, gaze conjugate; mucous membranes moist, dentition good; voice normal  Neck - symmetric on extension; no palpable anterior or posterior cervical adenopathy; thyroid is mildly enlarged, firm, non-tender; dominant nodule approx 3 cm in mid right thyroid lobe  Chest - clear bilaterally with rhonchi, rales, or wheeze  Cor - regular rhythm with normal rate; no significant murmur  Ext - non-tender without significant edema or lymphedema  Neuro - grossly intact; no tremor    Assessment & Plan  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 I provided them with written literature regarding thyroid surgery to review at home.  The patient has bilateral thyroid nodules. The dominant nodule in the left thyroid lobe measuring 3.8 cm is a follicular neoplasm of uncertain behavior. Cytopathology shows a variety of nuclear changes. There are calcifications present. There are bilateral thyroid nodules on ultrasound. Patient has a history of significant radiation exposure. Based on all of these findings, I suspect that his risk of malignancy is 25-30%.  I recommend total thyroidectomy for management and definitive diagnosis. We have discussed the procedure at length. We have discussed the  risk and benefits including the risk of injury to recurrent laryngeal nerve and to parathyroid glands. We have discussed the hospital stay, the postoperative recovery, and the need for lifelong thyroid hormone replacement. We have discussed the potential need for radiation therapy.  Patient required preoperative cardiac clearance given his history of bradycardia and coronary artery disease.  The risks and benefits of the procedure have been discussed at length with the patient. The patient understands the proposed procedure, potential alternative treatments, and the course of recovery to be expected. All of the patient's questions have been answered at this time. The patient wishes to proceed with surgery.  Earnstine Regal, MD, [facility] Office: [phone]

## 2014-10-18 ENCOUNTER — Ambulatory Visit (HOSPITAL_COMMUNITY): Payer: BLUE CROSS/BLUE SHIELD | Admitting: Anesthesiology

## 2014-10-18 ENCOUNTER — Encounter (HOSPITAL_COMMUNITY)
Admission: RE | Disposition: A | Discharge status: Home / Self Care | Payer: Self-pay | Source: Ambulatory Visit | Attending: Surgery

## 2014-10-18 ENCOUNTER — Encounter (HOSPITAL_COMMUNITY): Payer: Self-pay | Admitting: *Deleted

## 2014-10-18 ENCOUNTER — Observation Stay (HOSPITAL_COMMUNITY)
Admission: RE | Admit: 2014-10-18 | Discharge: 2014-10-19 | Disposition: A | Discharge status: Home / Self Care | Payer: BLUE CROSS/BLUE SHIELD | Source: Ambulatory Visit | Attending: Surgery | Admitting: Surgery

## 2014-10-18 DIAGNOSIS — Z86010 Personal history of colonic polyps: Secondary | ICD-10-CM | POA: Insufficient documentation

## 2014-10-18 DIAGNOSIS — C73 Malignant neoplasm of thyroid gland: Secondary | ICD-10-CM | POA: Diagnosis not present

## 2014-10-18 DIAGNOSIS — E041 Nontoxic single thyroid nodule: Secondary | ICD-10-CM | POA: Diagnosis present

## 2014-10-18 DIAGNOSIS — Z923 Personal history of irradiation: Secondary | ICD-10-CM | POA: Insufficient documentation

## 2014-10-18 DIAGNOSIS — F1099 Alcohol use, unspecified with unspecified alcohol-induced disorder: Secondary | ICD-10-CM | POA: Insufficient documentation

## 2014-10-18 DIAGNOSIS — I10 Essential (primary) hypertension: Secondary | ICD-10-CM | POA: Diagnosis not present

## 2014-10-18 DIAGNOSIS — Z7982 Long term (current) use of aspirin: Secondary | ICD-10-CM | POA: Insufficient documentation

## 2014-10-18 DIAGNOSIS — I2510 Atherosclerotic heart disease of native coronary artery without angina pectoris: Secondary | ICD-10-CM | POA: Diagnosis not present

## 2014-10-18 DIAGNOSIS — J45909 Unspecified asthma, uncomplicated: Secondary | ICD-10-CM | POA: Insufficient documentation

## 2014-10-18 DIAGNOSIS — K219 Gastro-esophageal reflux disease without esophagitis: Secondary | ICD-10-CM | POA: Insufficient documentation

## 2014-10-18 DIAGNOSIS — Z8546 Personal history of malignant neoplasm of prostate: Secondary | ICD-10-CM | POA: Insufficient documentation

## 2014-10-18 DIAGNOSIS — G43909 Migraine, unspecified, not intractable, without status migrainosus: Secondary | ICD-10-CM | POA: Insufficient documentation

## 2014-10-18 DIAGNOSIS — D440 Neoplasm of uncertain behavior of thyroid gland: Secondary | ICD-10-CM | POA: Diagnosis present

## 2014-10-18 DIAGNOSIS — I739 Peripheral vascular disease, unspecified: Secondary | ICD-10-CM | POA: Diagnosis not present

## 2014-10-18 HISTORY — PX: THYROIDECTOMY: SHX17

## 2014-10-18 SURGERY — THYROIDECTOMY
Anesthesia: General | Site: Neck

## 2014-10-18 MED ORDER — LACTATED RINGERS IV SOLN
INTRAVENOUS | Status: DC | PRN
  Administered 2014-10-18: 07:00:00 via INTRAVENOUS
  Administered 2014-10-18: 1000 mL

## 2014-10-18 MED ORDER — ATROPINE SULFATE 0.4 MG/ML IJ SOLN
INTRAMUSCULAR | Status: DC | PRN
  Administered 2014-10-18: 0.2 mg via INTRAVENOUS

## 2014-10-18 MED ORDER — PROPOFOL 10 MG/ML IV BOLUS
INTRAVENOUS | Status: DC | PRN
Start: 2014-10-18 — End: 2014-10-18
  Administered 2014-10-18: 200 mg via INTRAVENOUS

## 2014-10-18 MED ORDER — HYDROMORPHONE HCL 1 MG/ML IJ SOLN
INTRAMUSCULAR | Status: AC
  Filled 2014-10-18: qty 1

## 2014-10-18 MED ORDER — ACETAMINOPHEN 325 MG PO TABS
650.0000 mg | ORAL_TABLET | ORAL | Status: DC | PRN
  Administered 2014-10-19: 650 mg via ORAL
  Filled 2014-10-18: qty 2

## 2014-10-18 MED ORDER — SUCCINYLCHOLINE CHLORIDE 20 MG/ML IJ SOLN
INTRAMUSCULAR | Status: DC | PRN
  Administered 2014-10-18: 120 mg via INTRAVENOUS

## 2014-10-18 MED ORDER — ONDANSETRON HCL 4 MG/2ML IJ SOLN
INTRAMUSCULAR | Status: DC | PRN
  Administered 2014-10-18: 4 mg via INTRAVENOUS

## 2014-10-18 MED ORDER — CALCIUM CARBONATE 1250 (500 CA) MG PO TABS
2.0000 | ORAL_TABLET | Freq: Three times a day (TID) | ORAL | Status: DC
  Administered 2014-10-18 – 2014-10-19 (×3): 1000 mg via ORAL
  Filled 2014-10-18 (×6): qty 2

## 2014-10-18 MED ORDER — LIDOCAINE HCL (CARDIAC) 20 MG/ML IV SOLN
INTRAVENOUS | Status: AC
  Filled 2014-10-18: qty 5

## 2014-10-18 MED ORDER — 0.9 % SODIUM CHLORIDE (POUR BTL) OPTIME
TOPICAL | Status: DC | PRN
  Administered 2014-10-18: 1000 mL

## 2014-10-18 MED ORDER — MIDAZOLAM HCL 2 MG/2ML IJ SOLN
INTRAMUSCULAR | Status: AC
  Filled 2014-10-18: qty 2

## 2014-10-18 MED ORDER — FENTANYL CITRATE 0.05 MG/ML IJ SOLN
INTRAMUSCULAR | Status: AC
  Filled 2014-10-18: qty 5

## 2014-10-18 MED ORDER — ONDANSETRON HCL 4 MG/2ML IJ SOLN: 4.0000 mg | Freq: Four times a day (QID) | INTRAMUSCULAR | Status: DC | PRN

## 2014-10-18 MED ORDER — EPHEDRINE SULFATE 50 MG/ML IJ SOLN
INTRAMUSCULAR | Status: DC | PRN
  Administered 2014-10-18 (×2): 10 mg via INTRAVENOUS

## 2014-10-18 MED ORDER — LIDOCAINE HCL (CARDIAC) 20 MG/ML IV SOLN
INTRAVENOUS | Status: DC | PRN
  Administered 2014-10-18: 50 mg via INTRAVENOUS

## 2014-10-18 MED ORDER — HYDROCODONE-ACETAMINOPHEN 5-325 MG PO TABS
1.0000 | ORAL_TABLET | ORAL | Status: DC | PRN
  Administered 2014-10-18: 2 via ORAL
  Administered 2014-10-18: 1 via ORAL
  Filled 2014-10-18 (×2): qty 2

## 2014-10-18 MED ORDER — NEOSTIGMINE METHYLSULFATE 10 MG/10ML IV SOLN
INTRAVENOUS | Status: DC | PRN
  Administered 2014-10-18: 3 mg via INTRAVENOUS

## 2014-10-18 MED ORDER — GLYCOPYRROLATE 0.2 MG/ML IJ SOLN
INTRAMUSCULAR | Status: AC
  Filled 2014-10-18: qty 1

## 2014-10-18 MED ORDER — ONDANSETRON HCL 4 MG PO TABS: 4.0000 mg | ORAL_TABLET | Freq: Four times a day (QID) | ORAL | Status: DC | PRN

## 2014-10-18 MED ORDER — ONDANSETRON HCL 4 MG/2ML IJ SOLN
INTRAMUSCULAR | Status: AC
  Filled 2014-10-18: qty 2

## 2014-10-18 MED ORDER — HYDRALAZINE HCL 20 MG/ML IJ SOLN
5.0000 mg | Freq: Four times a day (QID) | INTRAMUSCULAR | Status: AC | PRN
  Administered 2014-10-18: 5 mg via INTRAVENOUS

## 2014-10-18 MED ORDER — KCL IN DEXTROSE-NACL 20-5-0.45 MEQ/L-%-% IV SOLN
INTRAVENOUS | Status: DC
  Administered 2014-10-18: 14:00:00 via INTRAVENOUS
  Filled 2014-10-18 (×2): qty 1000

## 2014-10-18 MED ORDER — GABAPENTIN 300 MG PO CAPS
300.0000 mg | ORAL_CAPSULE | Freq: Every day | ORAL | Status: DC
  Administered 2014-10-18: 300 mg via ORAL
  Filled 2014-10-18 (×2): qty 1

## 2014-10-18 MED ORDER — HYDRALAZINE HCL 20 MG/ML IJ SOLN
INTRAMUSCULAR | Status: AC
  Filled 2014-10-18: qty 1

## 2014-10-18 MED ORDER — ROCURONIUM BROMIDE 100 MG/10ML IV SOLN
INTRAVENOUS | Status: AC
  Filled 2014-10-18: qty 1

## 2014-10-18 MED ORDER — EPHEDRINE SULFATE 50 MG/ML IJ SOLN
INTRAMUSCULAR | Status: AC
  Filled 2014-10-18: qty 1

## 2014-10-18 MED ORDER — HYDROMORPHONE HCL 1 MG/ML IJ SOLN
0.2500 mg | INTRAMUSCULAR | Status: DC | PRN
  Administered 2014-10-18 (×4): 0.5 mg via INTRAVENOUS

## 2014-10-18 MED ORDER — HYDRALAZINE HCL 20 MG/ML IJ SOLN
5.0000 mg | Freq: Once | INTRAMUSCULAR | Status: AC
  Administered 2014-10-18: 5 mg via INTRAVENOUS

## 2014-10-18 MED ORDER — GLYCOPYRROLATE 0.2 MG/ML IJ SOLN
INTRAMUSCULAR | Status: DC | PRN
  Administered 2014-10-18 (×2): 0.2 mg via INTRAVENOUS
  Administered 2014-10-18: 0.4 mg via INTRAVENOUS
  Administered 2014-10-18: 0.2 mg via INTRAVENOUS

## 2014-10-18 MED ORDER — MIDAZOLAM HCL 5 MG/5ML IJ SOLN
INTRAMUSCULAR | Status: DC | PRN
  Administered 2014-10-18 (×2): 1 mg via INTRAVENOUS

## 2014-10-18 MED ORDER — DEXAMETHASONE SODIUM PHOSPHATE 10 MG/ML IJ SOLN
INTRAMUSCULAR | Status: DC | PRN
  Administered 2014-10-18: 10 mg via INTRAVENOUS

## 2014-10-18 MED ORDER — CEFAZOLIN SODIUM-DEXTROSE 2-3 GM-% IV SOLR
2.0000 g | INTRAVENOUS | Status: AC
  Administered 2014-10-18: 2 g via INTRAVENOUS

## 2014-10-18 MED ORDER — HYDROMORPHONE HCL 1 MG/ML IJ SOLN
1.0000 mg | INTRAMUSCULAR | Status: DC | PRN
  Administered 2014-10-18: 1 mg via INTRAVENOUS
  Filled 2014-10-18: qty 1

## 2014-10-18 MED ORDER — FENTANYL CITRATE 0.05 MG/ML IJ SOLN
INTRAMUSCULAR | Status: DC | PRN
  Administered 2014-10-18 (×2): 50 ug via INTRAVENOUS
  Administered 2014-10-18: 100 ug via INTRAVENOUS
  Administered 2014-10-18: 50 ug via INTRAVENOUS

## 2014-10-18 MED ORDER — METOPROLOL SUCCINATE ER 50 MG PO TB24
50.0000 mg | ORAL_TABLET | Freq: Every morning | ORAL | Status: DC
  Administered 2014-10-19: 50 mg via ORAL
  Filled 2014-10-18: qty 1

## 2014-10-18 MED ORDER — CYCLOSPORINE 0.05 % OP EMUL
1.0000 [drp] | Freq: Two times a day (BID) | OPHTHALMIC | Status: DC
  Administered 2014-10-18 – 2014-10-19 (×3): 1 [drp] via OPHTHALMIC
  Filled 2014-10-18 (×4): qty 1

## 2014-10-18 MED ORDER — PROPOFOL 10 MG/ML IV BOLUS
INTRAVENOUS | Status: AC
  Filled 2014-10-18: qty 20

## 2014-10-18 MED ORDER — ONDANSETRON HCL 4 MG/2ML IJ SOLN: 4.0000 mg | Freq: Once | INTRAMUSCULAR | Status: DC | PRN

## 2014-10-18 MED ORDER — ROCURONIUM BROMIDE 100 MG/10ML IV SOLN
INTRAVENOUS | Status: DC | PRN
  Administered 2014-10-18: 40 mg via INTRAVENOUS

## 2014-10-18 MED ORDER — SODIUM CHLORIDE 0.9 % IJ SOLN
INTRAMUSCULAR | Status: AC
  Filled 2014-10-18: qty 10

## 2014-10-18 MED ORDER — CEFAZOLIN SODIUM-DEXTROSE 2-3 GM-% IV SOLR
INTRAVENOUS | Status: AC
  Filled 2014-10-18: qty 50

## 2014-10-18 SURGICAL SUPPLY — 37 items
APL SKNCLS STERI-STRIP NONHPOA (GAUZE/BANDAGES/DRESSINGS)
ATTRACTOMAT 16X20 MAGNETIC DRP (DRAPES) ×3 IMPLANT
BENZOIN TINCTURE PRP APPL 2/3 (GAUZE/BANDAGES/DRESSINGS) IMPLANT
BLADE HEX COATED 2.75 (ELECTRODE) ×3 IMPLANT
BLADE SURG 15 STRL LF DISP TIS (BLADE) ×1 IMPLANT
BLADE SURG 15 STRL SS (BLADE) ×3
CHLORAPREP W/TINT 26ML (MISCELLANEOUS) ×3 IMPLANT
CLIP TI MEDIUM 6 (CLIP) ×12 IMPLANT
CLIP TI WIDE RED SMALL 6 (CLIP) ×10 IMPLANT
CLOSURE WOUND 1/2 X4 (GAUZE/BANDAGES/DRESSINGS) ×1
DISSECTOR ROUND CHERRY 3/8 STR (MISCELLANEOUS) ×2 IMPLANT
DRAPE LAPAROTOMY T 98X78 PEDS (DRAPES) ×3 IMPLANT
DRESSING SURGICEL FIBRLLR 1X2 (HEMOSTASIS) ×1 IMPLANT
DRSG SURGICEL FIBRILLAR 1X2 (HEMOSTASIS) ×3
ELECT REM PT RETURN 9FT ADLT (ELECTROSURGICAL) ×3
ELECTRODE REM PT RTRN 9FT ADLT (ELECTROSURGICAL) ×1 IMPLANT
GAUZE SPONGE 4X4 12PLY STRL (GAUZE/BANDAGES/DRESSINGS) IMPLANT
GAUZE SPONGE 4X4 16PLY XRAY LF (GAUZE/BANDAGES/DRESSINGS) ×3 IMPLANT
GLOVE SURG ORTHO 8.0 STRL STRW (GLOVE) ×3 IMPLANT
GOWN STRL REUS W/TWL XL LVL3 (GOWN DISPOSABLE) ×6 IMPLANT
KIT BASIN OR (CUSTOM PROCEDURE TRAY) ×3 IMPLANT
LIQUID BAND (GAUZE/BANDAGES/DRESSINGS) ×3 IMPLANT
PACK BASIC VI WITH GOWN DISP (CUSTOM PROCEDURE TRAY) ×3 IMPLANT
PENCIL BUTTON HOLSTER BLD 10FT (ELECTRODE) ×3 IMPLANT
SHEARS HARMONIC 9CM CVD (BLADE) ×3 IMPLANT
STAPLER VISISTAT 35W (STAPLE) IMPLANT
STRIP CLOSURE SKIN 1/2X4 (GAUZE/BANDAGES/DRESSINGS) ×2 IMPLANT
SUT MNCRL AB 4-0 PS2 18 (SUTURE) ×3 IMPLANT
SUT SILK 2 0 (SUTURE) ×3
SUT SILK 2-0 18XBRD TIE 12 (SUTURE) IMPLANT
SUT SILK 3 0 (SUTURE) ×3
SUT SILK 3-0 18XBRD TIE 12 (SUTURE) IMPLANT
SUT VIC AB 3-0 SH 18 (SUTURE) ×6 IMPLANT
SYR BULB IRRIGATION 50ML (SYRINGE) ×3 IMPLANT
TOWEL OR 17X26 10 PK STRL BLUE (TOWEL DISPOSABLE) ×3 IMPLANT
TOWEL OR NON WOVEN STRL DISP B (DISPOSABLE) ×3 IMPLANT
YANKAUER SUCT BULB TIP 10FT TU (MISCELLANEOUS) ×3 IMPLANT

## 2014-10-18 NOTE — Interval H&P Note (Signed)
History and Physical Interval Note:  10/18/2014 7:27 AM  Luis Kane.  has presented today for surgery, with the diagnosis of THYROID NEOPLASM OF UNCERTAIN BEHAVIOR.  The various methods of treatment have been discussed with the patient and family. After consideration of risks, benefits and other options for treatment, the patient has consented to    Procedure(s): TOTAL THYROIDECTOMY (N/A) as a surgical intervention .    The patient's history has been reviewed, patient examined, no change in status, stable for surgery.  I have reviewed the patient's chart and labs.  Questions were answered to the patient's satisfaction.    Earnstine Regal, MD, [facility] Office: [place]

## 2014-10-18 NOTE — Progress Notes (Signed)
Pt is using home CPAP machine, nasal mask and tubing. Sterile water was added for humdification. Pt states he will place himself on CPAP when ready.  Machine appears to be in good working condition and there are no frays in the power cord. [employer] has been contacted and will inspect CPAP tomorrow when available. RT will continue to monitor as needed.

## 2014-10-18 NOTE — Progress Notes (Signed)
Has been taking Amoxicillin since 10/15/14 for sinus infection. Patient states he does feel better. Lungs clear Temp 97.5

## 2014-10-18 NOTE — Transfer of Care (Signed)
Immediate Anesthesia Transfer of Care Note  Patient: Luis Kane.  Procedure(s) Performed: Procedure(s): TOTAL THYROIDECTOMY (N/A)  Patient Location: PACU  Anesthesia Type:General  Level of Consciousness: awake, alert  and oriented  Airway & Oxygen Therapy: Patient Spontanous Breathing and Patient connected to face mask oxygen  Post-op Assessment: Report given to RN and Post -op Vital signs reviewed and stable  Post vital signs: Reviewed and stable  Last Vitals:  Filed Vitals:   10/18/14 0613  BP:   Pulse: 52  Temp:   Resp:     Complications: No apparent anesthesia complications

## 2014-10-18 NOTE — Progress Notes (Signed)
Pt's aline pressure still elevated, cuff pressure better; Dr. Jillyn Hidden notified, order rec'd and additional med given

## 2014-10-18 NOTE — Progress Notes (Signed)
Pt's blood pressure elevated; pt cont to c/o pain in neck and left foot where a line located; Dr. Jillyn Hidden in to see pt, order rec'd and med given; pain med continued

## 2014-10-18 NOTE — Op Note (Signed)
Procedure Note  Pre-operative Diagnosis:  Thyroid neoplasm of uncertain behavior, bilateral thyroid nodules  Post-operative Diagnosis:  same  Surgeon:  Earnstine Regal, MD, FACS  Assistant:  Ivor Messier, MD, FACS   Procedure:  Total thyroidectomy  Anesthesia:  General  Estimated Blood Loss:  minimal  Drains: none         Specimen: thyroid to pathology  Indications:  The patient is a 64 year old male who presents with a thyroid nodule. Patient is referred by Dr. Derinda Late for evaluation of thyroid neoplasm of uncertain behavior. Patient was noted on physical exam by his primary care physician to have a palpable mass in the thyroid. Patient subsequently underwent thyroid ultrasound which demonstrated a mildly enlarged thyroid gland with a dominant nodule in the right lobe measuring 3.1 cm and a dominant nodule in the left lobe measuring 3.8 cm. 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 There were nuclear changes. There were calcifications. Patient has a history of radiation exposure as a child. This was performed in [facility] in [place] for thyroid goiter and asthma. Patient has never been on thyroid medication. He has had no prior history of neck surgery. There is no family history of thyroid disease. There is no family history of other endocrine neoplasm.  Procedure Details: Procedure was done in OR #6 at the [facility].  The patient was brought to the operating room and placed in a supine position on the operating room table.  Following administration of general anesthesia, the patient was positioned and then prepped and draped in the usual aseptic fashion.  After ascertaining that an adequate level of anesthesia had been achieved, a Kocher incision was made with #15 blade.  Dissection was carried through subcutaneous tissues and platysma. Hemostasis was achieved with the electrocautery.  Skin  flaps were elevated cephalad and caudad from the thyroid notch to the sternal notch.  The Mahorner self-retaining retractor was placed for exposure.  Strap muscles were incised in the midline and dissection was begun on the left side.  Strap muscles were reflected laterally.  Left thyroid lobe was enlarged with a dominant 3-4 cm nodule.  The left lobe was gently mobilized with blunt dissection.  Superior pole vessels were dissected out and divided individually between small and medium Ligaclips with the Harmonic scalpel.  The thyroid lobe was rolled anteriorly.  Branches of the inferior thyroid artery were divided between small Ligaclips with the Harmonic scalpel.  Inferior venous tributaries were divided between Ligaclips.  Both the superior and inferior parathyroid glands were identified and preserved on their vascular pedicles.  The recurrent laryngeal nerve was identified and preserved along its course.  The ligament of Gwenlyn Found was released with the electrocautery and the gland was mobilized onto the anterior trachea. Isthmus was mobilized across the midline.  There was diminutive pyramidal lobe present.  Dry pack was placed in the left neck.  Next, the right thyroid lobe was gently mobilized with blunt dissection.  Right thyroid lobe was enlarged with multiple nodules.  Superior pole vessels were dissected out and divided between small and medium Ligaclips with the Harmonic scalpel.  Superior parathyroid was identified and preserved.  Inferior venous tributaries were divided between medium Ligaclips with the Harmonic scalpel.  The right thyroid lobe was rolled anteriorly and the branches of the inferior thyroid artery divided between small Ligaclips.  The right recurrent laryngeal nerve was identified and preserved along its course.  The  ligament of Gwenlyn Found was released with the electrocautery.  The right thyroid lobe was mobilized onto the anterior trachea and the remainder of the thyroid was dissected off the  anterior trachea and the thyroid was completely excised.  A single suture was used to mark the right superior pole. A double suture was used to mark the left superior pole. The entire thyroid gland was submitted to pathology for review.  The neck was irrigated with warm saline.  Fibular was placed throughout the operative field.  Strap muscles were reapproximated in the midline with interrupted 3-0 Vicryl sutures.  Platysma was closed with interrupted 3-0 Vicryl sutures.  Skin was closed with a running 4-0 Monocryl subcuticular suture.  Wound was washed and dried and topical glue (Dermabond) was applied to the skin as dressing.  The patient was awakened from anesthesia and brought to the recovery room.  The patient tolerated the procedure well.   Earnstine Regal, MD, [facility] Office: [phone]

## 2014-10-18 NOTE — Anesthesia Preprocedure Evaluation (Addendum)
Anesthesia Evaluation  Patient identified by MRN, date of birth, ID band Patient awake    Reviewed: Allergy & Precautions, NPO status , Patient's Chart, lab work & pertinent test results  History of Anesthesia Complications Negative for: history of anesthetic complications  Airway Mallampati: III  TM Distance: >3 FB Neck ROM: Full  Mouth opening: Limited Mouth Opening  Dental no notable dental hx. (+) Dental Advisory Given   Pulmonary sleep apnea ,  breath sounds clear to auscultation  Pulmonary exam normal       Cardiovascular hypertension, Pt. on medications and Pt. on home beta blockers + CAD and + Peripheral Vascular Disease (carotid stenosis) Rhythm:Regular Rate:Normal  Cleared by cardiology    Neuro/Psych negative neurological ROS  negative psych ROS   GI/Hepatic negative GI ROS, Neg liver ROS,   Endo/Other  negative endocrine ROS  Renal/GU negative Renal ROS  negative genitourinary   Musculoskeletal  (+) Arthritis -,   Abdominal   Peds negative pediatric ROS (+)  Hematology negative hematology ROS (+)   Anesthesia Other Findings Pinched nerve on the L which results in tingling and sharp pains down left arm with extension of neck  Reproductive/Obstetrics negative OB ROS                            Anesthesia Physical Anesthesia Plan  ASA: II  Anesthesia Plan: General   Post-op Pain Management:    Induction: Intravenous  Airway Management Planned: Oral ETT and Video Laryngoscope Planned  Additional Equipment:   Intra-op Plan:   Post-operative Plan: Extubation in OR  Informed Consent: I have reviewed the patients History and Physical, chart, labs and discussed the procedure including the risks, benefits and alternatives for the proposed anesthesia with the patient or authorized representative who has indicated his/her understanding and acceptance.   Dental advisory  given  Plan Discussed with: CRNA  Anesthesia Plan Comments: (Plan for use of glidescope given body habitus, limited mouth opening, and pinched nerve that is worsened by extension of his neck)       Anesthesia Quick Evaluation

## 2014-10-18 NOTE — Anesthesia Procedure Notes (Signed)
Procedure Name: Intubation Date/Time: 10/18/2014 7:39 AM Performed by: Glory Buff Pre-anesthesia Checklist: Patient identified, Emergency Drugs available, Suction available, Patient being monitored and Timeout performed Patient Re-evaluated:Patient Re-evaluated prior to inductionOxygen Delivery Method: Circle system utilized Preoxygenation: Pre-oxygenation with 100% oxygen Intubation Type: IV induction Ventilation: Mask ventilation without difficulty Laryngoscope Size: Glidescope and 3 Grade View: Grade I Tube type: Oral Tube size: 7.5 mm Number of attempts: 1 Airway Equipment and Method: Stylet,  Video-laryngoscopy and Rigid stylet (Positioned on pink foam head cradle and donut to comfort per patient.) Placement Confirmation: ETT inserted through vocal cords under direct vision,  positive ETCO2 and breath sounds checked- equal and bilateral Secured at: 21 cm Tube secured with: Tape Dental Injury: Teeth and Oropharynx as per pre-operative assessment

## 2014-10-18 NOTE — Anesthesia Postprocedure Evaluation (Signed)
  Anesthesia Post-op Note  Patient: Luis Kane.  Procedure(s) Performed: Procedure(s) (LRB): TOTAL THYROIDECTOMY (N/A)  Patient Location: PACU  Anesthesia Type: General  Level of Consciousness: awake and alert   Airway and Oxygen Therapy: Patient Spontanous Breathing  Post-op Pain: mild  Post-op Assessment: Post-op Vital signs reviewed, Patient's Cardiovascular Status Stable, Respiratory Function Stable, Patent Airway and No signs of Nausea or vomiting  Last Vitals:  Filed Vitals:   10/18/14 1315  BP: 170/72  Pulse: 48  Temp: 36.8 C  Resp: 16    Post-op Vital Signs: stable   Complications: No apparent anesthesia complications

## 2014-10-18 NOTE — Progress Notes (Signed)
Dr. Jillyn Hidden in to check pt; blood pressures better; OK to remove A Line since cuff pressures are stable and pt can go to regular floor; A Line removed left foot ; pressure held x 5 mins & pressure dressing applied

## 2014-10-19 DIAGNOSIS — C73 Malignant neoplasm of thyroid gland: Secondary | ICD-10-CM | POA: Diagnosis not present

## 2014-10-19 LAB — BASIC METABOLIC PANEL
ANION GAP: 11 (ref 5–15)
BUN: 13 mg/dL (ref 6–23)
CALCIUM: 9.4 mg/dL (ref 8.4–10.5)
CO2: 26 mmol/L (ref 19–32)
Chloride: 100 mmol/L (ref 96–112)
Creatinine, Ser: 0.9 mg/dL (ref 0.50–1.35)
GFR calc Af Amer: >90 mL/min (ref >90)
GFR, EST NON AFRICAN AMERICAN: 89 mL/min — AB (ref >90)
Glucose, Bld: 165 mg/dL — ABNORMAL HIGH (ref 70–99)
Potassium: 4.2 mmol/L (ref 3.5–5.1)
Sodium: 137 mmol/L (ref 135–145)

## 2014-10-19 MED ORDER — SYNTHROID 100 MCG PO TABS: 100.0000 ug | ORAL_TABLET | Freq: Every day | ORAL | Status: DC

## 2014-10-19 MED ORDER — CALCIUM CARBONATE-VITAMIN D 500-200 MG-UNIT PO TABS: 2.0000 | ORAL_TABLET | Freq: Two times a day (BID) | ORAL | Status: DC

## 2014-10-19 MED ORDER — OXYCODONE HCL 5 MG PO TABS: 5.0000 mg | ORAL_TABLET | ORAL | Status: DC | PRN

## 2014-10-19 NOTE — Discharge Summary (Signed)
Physician Discharge Summary [facility]  Patient ID: Luis Kane. MRN: [id number] DOB/AGE: 1951-04-04 64 y.o.  Admit date: 10/18/2014 Discharge date: 10/19/2014  Admission Diagnoses:  Thyroid neoplasm of uncertain behavior, bilateral thyroid nodules  Discharge Diagnoses:  Principal Problem:   Neoplasm of uncertain behavior of thyroid gland, bilateral   Discharged Condition: good  Hospital Course: Patient was admitted for observation following thyroid surgery.  Post op course was uncomplicated.  Pain was well controlled.  Tolerated diet.  Post op calcium level on morning following surgery was 9.4 mg/dl.  Patient was prepared for discharge home on POD#1.  Consults: None  Treatments: surgery: total thyroidectomy  Discharge Exam: Blood pressure 137/69, pulse 58, temperature 97.8 F (36.6 C), temperature source Axillary, resp. rate 16, height 5' 10.5" (1.791 m), weight 109.77 kg (242 lb), SpO2 96 %. HEENT - clear Neck - incision dry and intact; voice about normal Chest - clear bilaterally Cor - RRR  Disposition: Home  Discharge Instructions    Diet - low sodium heart healthy    Complete by:  As directed      Discharge instructions    Complete by:  As directed   [facility]  THYROID & PARATHYROID SURGERY:  POST-OP INSTRUCTIONS  Always review your discharge instruction sheet from the facility where your surgery was performed.  A prescription for pain medication may be given to you upon discharge.  Take your pain medication as prescribed.  If narcotic pain medicine is not needed, then you may take acetaminophen (Tylenol) or ibuprofen (Advil) as needed.  Take your usually prescribed medications unless otherwise directed.  If you need a refill on your pain medication, please contact your pharmacy. They will contact our office to request authorization.  Prescriptions will not be processed after 5 pm or on weekends.  Start with a light  diet upon arrival home, such as soup and crackers or toast.  Be sure to drink plenty of fluids daily.  Resume your normal diet the day after surgery.  Most patients will experience some swelling and bruising on the chest and neck area.  Ice packs will help.  Swelling and bruising can take several days to resolve.   It is common to experience some constipation after surgery.  Increasing fluid intake and taking a stool softener will usually help or prevent this problem.  A mild laxative (Milk of Magnesia or Miralax) should be taken according to package directions if there has been no bowel movement after 48 hours.  If you have steri-strips and a gauze dressing over your incision, you may remove the gauze bandage on the second day after surgery, and you may shower at that time.  Leave your steri-strips (small skin tapes) in place directly over the incision.  These strips should remain on the skin for 7-10 days and then be removed.  You may get them wet in the shower and pat them dry.  If you have Dermabond (topical glue) over your incision, you may shower at any time following your procedure.  Leave the glue in place for 10-14 days.  When it begins to flake off around the edges, you may remove it.  You may resume regular (light) daily activities beginning the next day-such as daily self-care, walking, climbing stairs-gradually increasing activities as tolerated.  You may have sexual intercourse when it is comfortable.  Refrain from any heavy lifting or straining until approved by your doctor.  You may drive when you no longer  are taking prescription pain medication, you can comfortably wear a seatbelt, and you can safely maneuver your car and apply brakes.  You should see your doctor in the office for a follow-up appointment approximately two to three weeks after your surgery.  Make sure that you call for this appointment within a day or two after you arrive home to insure a convenient appointment  time.  WHEN TO CALL YOUR DOCTOR: -- Fever greater than 101.5 -- Inability to urinate -- Nausea and/or vomiting - persistent -- Extreme swelling or bruising -- Continued bleeding from incision -- Increased pain, redness, or drainage from the incision -- Difficulty swallowing or breathing -- Muscle cramping or spasms -- Numbness or tingling in hands or around lips  The clinic staff is available to answer your questions during regular business hours.  Please don't hesitate to call and ask to speak to one of the nurses if you have concerns.  Earnstine Regal, MD, [facility] Office: [phone]  Website: [email or web address]     Increase activity slowly    Complete by:  As directed      No dressing needed    Complete by:  As directed             Medication List    TAKE these medications        amoxicillin 875 MG tablet  Commonly known as:  AMOXIL  Take 875 mg by mouth 2 (two) times daily.     aspirin EC 81 MG tablet  Take 81 mg by mouth at bedtime.     atorvastatin 40 MG tablet  Commonly known as:  LIPITOR  Take 40 mg by mouth daily.     calcium-vitamin D 500-200 MG-UNIT per tablet  Commonly known as:  OSCAL WITH D  Take 2 tablets by mouth 2 (two) times daily.     Chlorpheniramine-APAP 2-325 MG Tabs  Commonly known as:  CORICIDIN  Take 2 tablets by mouth every 4 (four) hours as needed (Nasal congestion).     cholecalciferol 1000 UNITS tablet  Commonly known as:  VITAMIN D  Take 1,000 Units by mouth daily.     cycloSPORINE 0.05 % ophthalmic emulsion  Commonly known as:  RESTASIS  Place 1 drop into both eyes 2 (two) times daily.     gabapentin 600 MG tablet  Commonly known as:  NEURONTIN  Take 300 mg by mouth at bedtime.     HYDROcodone-acetaminophen 5-325 MG per tablet  Commonly known as:  NORCO/VICODIN  Take 1 tablet by mouth every 6 (six) hours as needed for moderate pain.     ibuprofen 200  MG tablet  Commonly known as:  ADVIL,MOTRIN  Take 200-400 mg by mouth every 6 (six) hours as needed (Pain).     Ibuprofen-Diphenhydramine HCl 200-25 MG Caps  Take 1 tablet by mouth at bedtime as needed (Sleep).     MELATONIN TR 5-10 MG Tbcr  Generic drug:  Melatonin-Pyridoxine  Take 1 tablet by mouth at bedtime as needed (sleep).     metoprolol succinate 50 MG 24 hr tablet  Commonly known as:  TOPROL-XL  Take 50 mg by mouth every morning.     multivitamin tablet  Take 1 tablet by mouth daily.     oxyCODONE 5 MG immediate release tablet  Commonly known as:  Oxy IR/ROXICODONE  Take 1-2 tablets (5-10 mg total) by mouth every 4 (four) hours as needed for moderate pain.  SYNTHROID 100 MCG tablet  Generic drug:  levothyroxine  Take 1 tablet (100 mcg total) by mouth daily.         Earnstine Regal, MD, [facility] Office: [phone]   Signed: Earnstine Regal 10/19/2014, 9:57 AM

## 2014-10-19 NOTE — Progress Notes (Signed)
Nurse reviewed discharge instructions with pt.  Pt verbalized understanding of discharge instructions, follow up appointments and new medications.  No concerns at time of discharge. Prescription given to pt prior to discharge.

## 2014-10-19 NOTE — Progress Notes (Signed)
UR completed 

## 2014-10-20 ENCOUNTER — Encounter (HOSPITAL_COMMUNITY): Payer: Self-pay | Admitting: Surgery

## 2014-11-19 ENCOUNTER — Other Ambulatory Visit: Payer: Self-pay

## 2014-11-23 ENCOUNTER — Other Ambulatory Visit (HOSPITAL_COMMUNITY): Payer: Self-pay | Admitting: Diagnostic Radiology

## 2014-11-23 ENCOUNTER — Other Ambulatory Visit: Payer: Self-pay | Admitting: Internal Medicine

## 2014-11-23 DIAGNOSIS — C73 Malignant neoplasm of thyroid gland: Secondary | ICD-10-CM

## 2014-11-25 ENCOUNTER — Other Ambulatory Visit: Payer: Self-pay | Admitting: Family Medicine

## 2014-11-25 DIAGNOSIS — M5412 Radiculopathy, cervical region: Secondary | ICD-10-CM

## 2014-11-27 ENCOUNTER — Ambulatory Visit
Admission: RE | Admit: 2014-11-27 | Discharge: 2014-11-27 | Disposition: A | Discharge status: Home / Self Care | Payer: BLUE CROSS/BLUE SHIELD | Source: Ambulatory Visit | Attending: Family Medicine | Admitting: Family Medicine

## 2014-11-27 DIAGNOSIS — M5412 Radiculopathy, cervical region: Secondary | ICD-10-CM

## 2014-12-06 ENCOUNTER — Encounter (HOSPITAL_COMMUNITY)
Admission: RE | Admit: 2014-12-06 | Discharge: 2014-12-06 | Disposition: A | Discharge status: Home / Self Care | Payer: BLUE CROSS/BLUE SHIELD | Source: Ambulatory Visit | Attending: Internal Medicine | Admitting: Internal Medicine

## 2014-12-06 DIAGNOSIS — C73 Malignant neoplasm of thyroid gland: Secondary | ICD-10-CM | POA: Insufficient documentation

## 2014-12-06 MED ORDER — THYROTROPIN ALFA 1.1 MG IM SOLR
0.9000 mg | INTRAMUSCULAR | Status: AC
  Administered 2014-12-06: 0.9 mg via INTRAMUSCULAR

## 2014-12-07 ENCOUNTER — Encounter (HOSPITAL_COMMUNITY)
Admission: RE | Admit: 2014-12-07 | Discharge: 2014-12-07 | Disposition: A | Discharge status: Home / Self Care | Payer: BLUE CROSS/BLUE SHIELD | Source: Ambulatory Visit | Attending: Internal Medicine | Admitting: Internal Medicine

## 2014-12-07 DIAGNOSIS — C73 Malignant neoplasm of thyroid gland: Secondary | ICD-10-CM | POA: Diagnosis not present

## 2014-12-07 MED ORDER — THYROTROPIN ALFA 1.1 MG IM SOLR
0.9000 mg | INTRAMUSCULAR | Status: AC
  Administered 2014-12-07: 0.9 mg via INTRAMUSCULAR

## 2014-12-08 ENCOUNTER — Encounter (HOSPITAL_COMMUNITY)
Admission: RE | Admit: 2014-12-08 | Discharge: 2014-12-08 | Disposition: A | Discharge status: Home / Self Care | Payer: BLUE CROSS/BLUE SHIELD | Source: Ambulatory Visit | Attending: Internal Medicine | Admitting: Internal Medicine

## 2014-12-08 DIAGNOSIS — C73 Malignant neoplasm of thyroid gland: Secondary | ICD-10-CM | POA: Diagnosis not present

## 2014-12-08 MED ORDER — SODIUM IODIDE I 131 CAPSULE
130.3000 | Freq: Once | INTRAVENOUS | Status: AC | PRN
  Administered 2014-12-08: 130.3 via ORAL

## 2014-12-17 ENCOUNTER — Encounter (HOSPITAL_COMMUNITY)
Admission: RE | Admit: 2014-12-17 | Discharge: 2014-12-17 | Disposition: A | Discharge status: Home / Self Care | Payer: BLUE CROSS/BLUE SHIELD | Source: Ambulatory Visit | Attending: Internal Medicine | Admitting: Internal Medicine

## 2014-12-17 DIAGNOSIS — C73 Malignant neoplasm of thyroid gland: Secondary | ICD-10-CM

## 2015-03-01 ENCOUNTER — Encounter: Payer: Self-pay | Admitting: *Deleted

## 2015-03-23 ENCOUNTER — Encounter: Payer: Self-pay | Admitting: Cardiology

## 2015-05-31 ENCOUNTER — Ambulatory Visit: Payer: BC Managed Care – PPO | Admitting: Pulmonary Disease

## 2015-06-01 IMAGING — US US SOFT TISSUE HEAD/NECK
1 series · 13 of 25 positions shown · non-contrast
Comparison: CT of the chest 09/22/2009

CLINICAL DATA: 63-year-old male referred for ultrasound of the
thyroid. Questionable goiter

EXAM:
THYROID ULTRASOUND
TECHNIQUE: Ultrasound examination of the thyroid gland and adjacent soft
tissues was performed.

[Series 1: us soft tissue head/neck · 0.08mm/px · 13 of 49 slices shown]
[im 1/49]
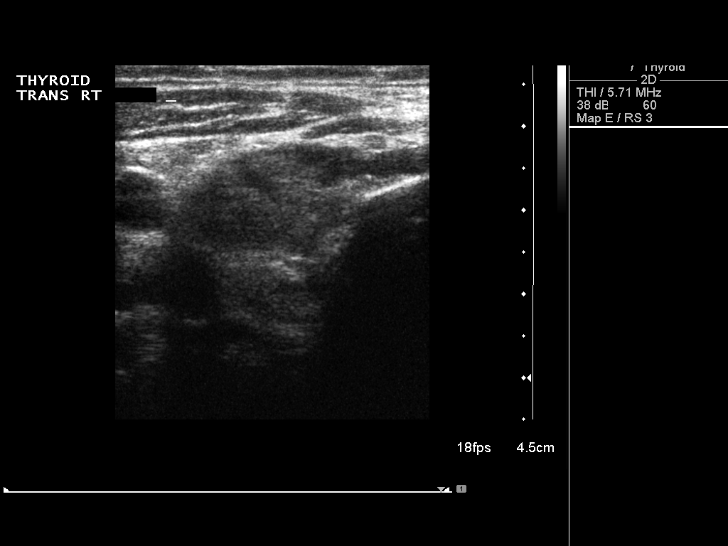
[im 5/49]
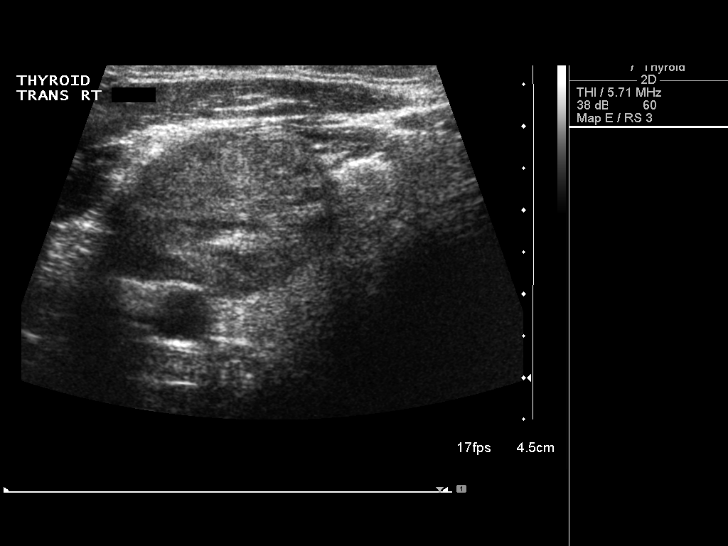
[im 9/49]
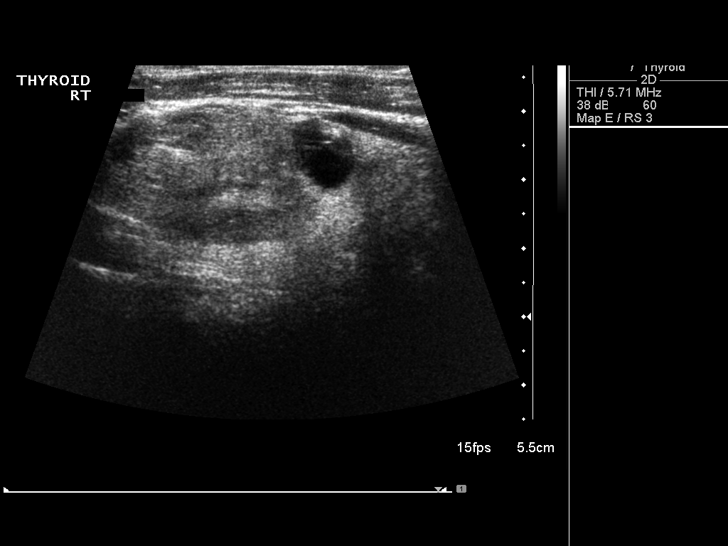
[im 13/49]
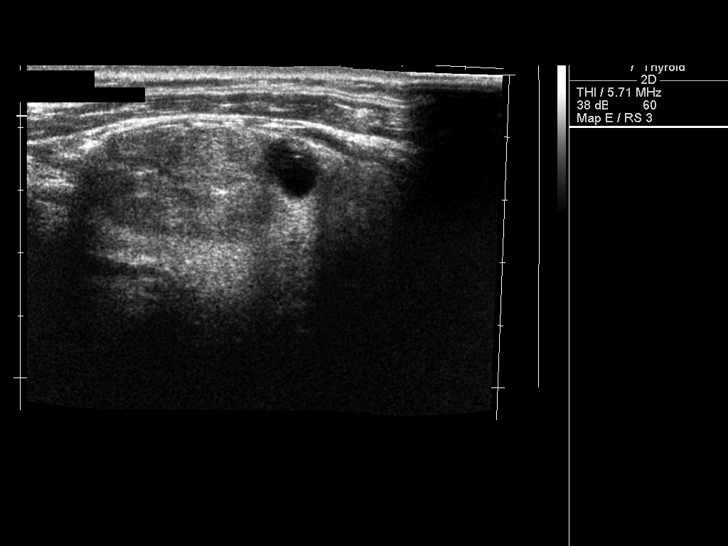
[im 17/49]
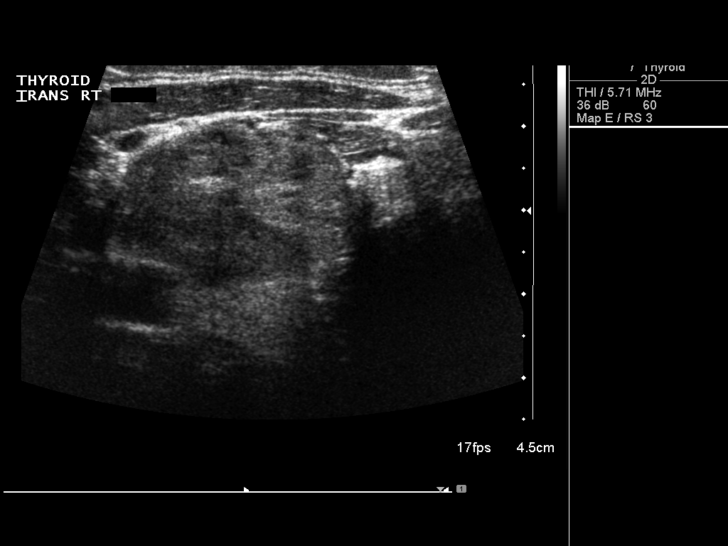
[im 21/49]
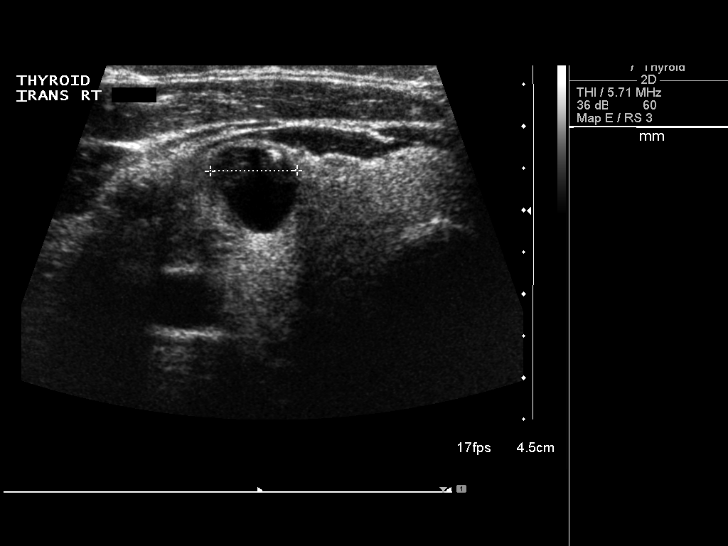
[im 25/49]
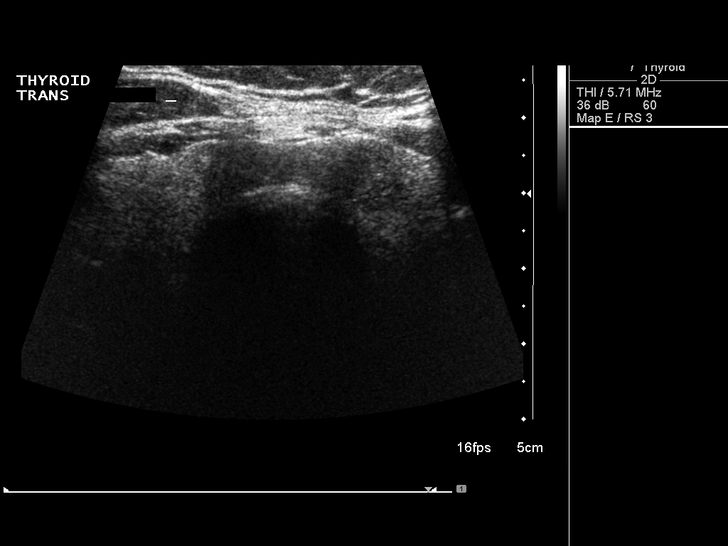
[im 29/49]
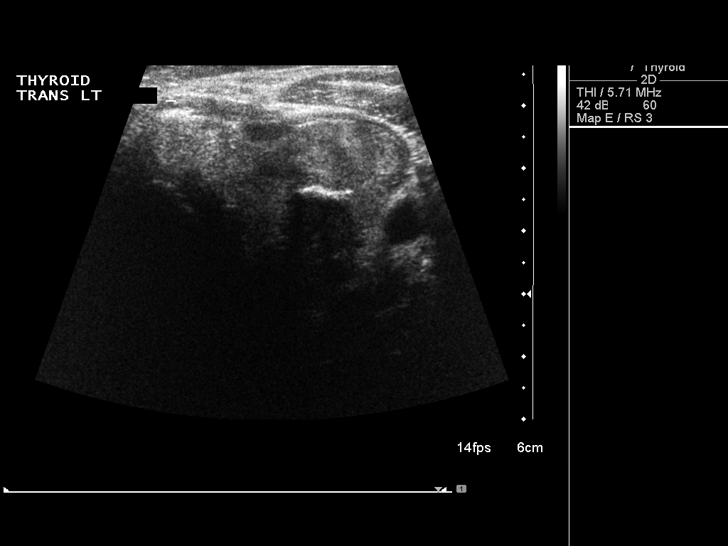
[im 33/49]
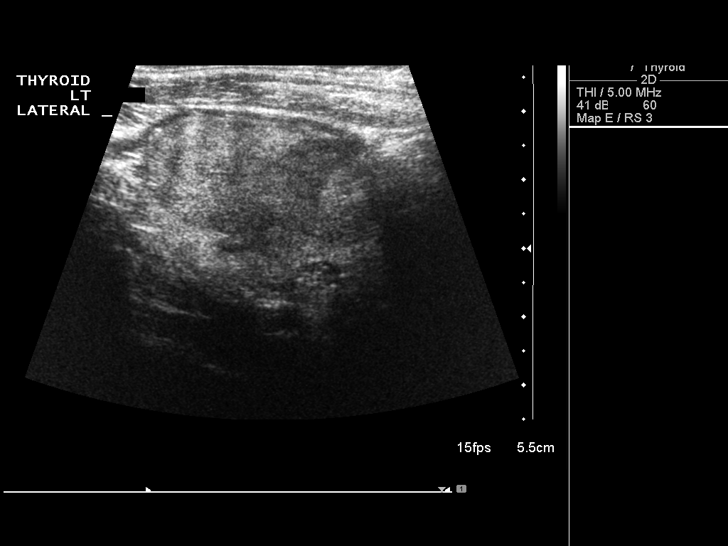
[im 37/49]
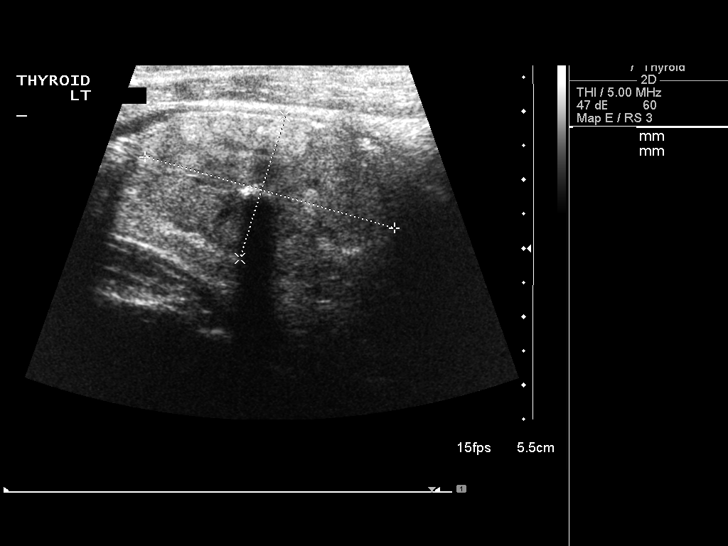
[im 41/49]
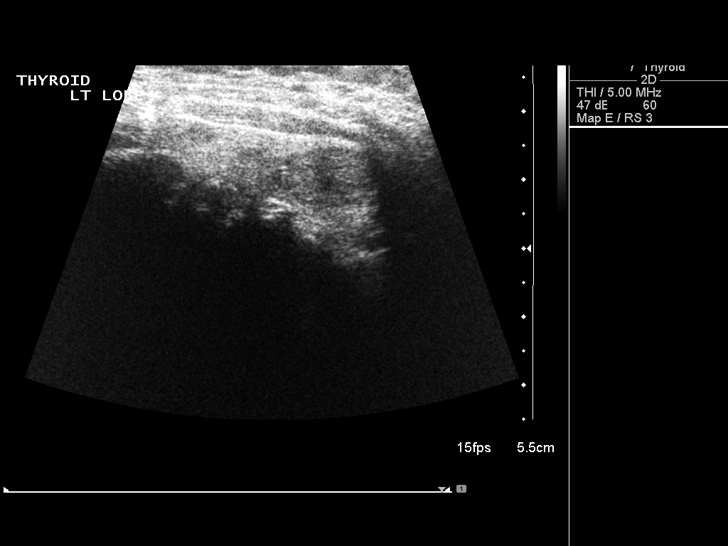
[im 45/49]
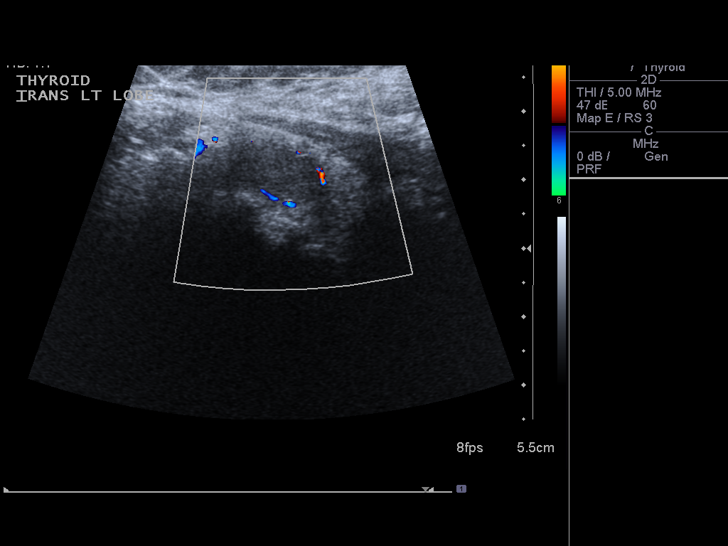
[im 49/49]
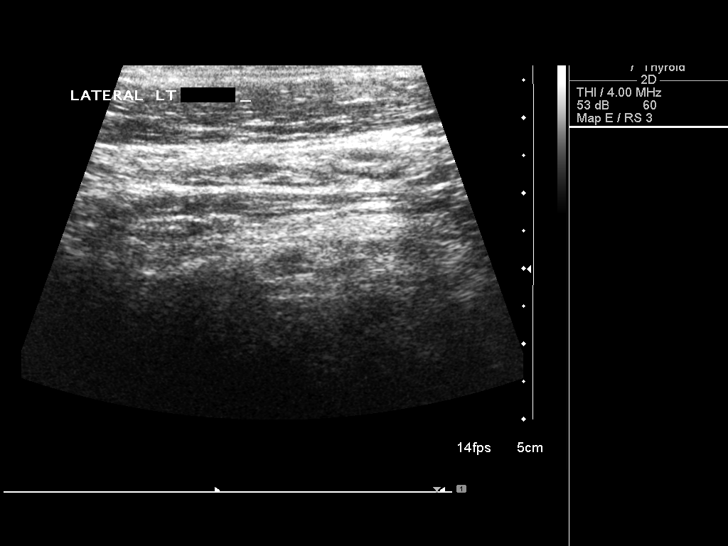

[13 of 25 positions shown; findings below may reference images not displayed]

FINDINGS: Right thyroid lobe

Measurements: 5.9 cm, 2.8 cm, 3.5 cm. Dominant solid nodule within
the right thyroid lobe measures 3.1 cm x 2.0 cm x 2.7 cm with
minimal internal flow. There is an adjacent cyst with wall
calcification measuring 1.3 cm x 1.0 cm. The remainder of
right-sided thyroid tissue is relatively homogeneous.

Left thyroid lobe

Measurements: 6.4 cm, 3.4 cm, 2.9 cm. The dominant nodule on the
left measures 3.8 cm x 2.2 cm x 2.0 cm. Additional more medial
nodule the left thyroid measures 1.8 cm x 1.6 cm x 9 mm. Each of
these are predominantly solid with coarse internal calcifications
and no significant internal flow.

Isthmus

Thickness: 6 mm.  No nodules visualized.

Lymphadenopathy

None visualized.
IMPRESSION: Bilateral thyroid nodules. Dominant right-sided nodule measures
cm and dominant left-sided nodule measures 3.8 cm. Findings meet
consensus criteria for biopsy. Ultrasound-guided fine needle
aspiration should be considered, as per the consensus statement:
Management of Thyroid Nodules Detected at US: Society of
Radiologists in Ultrasound Consensus Conference Statement. Radiology
7771; [DATE].

## 2015-06-10 IMAGING — US US THYROID BIOPSY
1 series · 14 of 21 positions shown · non-contrast
Comparison: None.

CLINICAL DATA: Bilateral dominant thyroid nodules

EXAM:
ULTRASOUND GUIDED NEEDLE ASPIRATE BIOPSY OF THE THYROID GLAND

[Series 1: us thyroid biopsy · 0.08mm/px · 21 acquisitions, 14 frames shown]
[im 1/21]
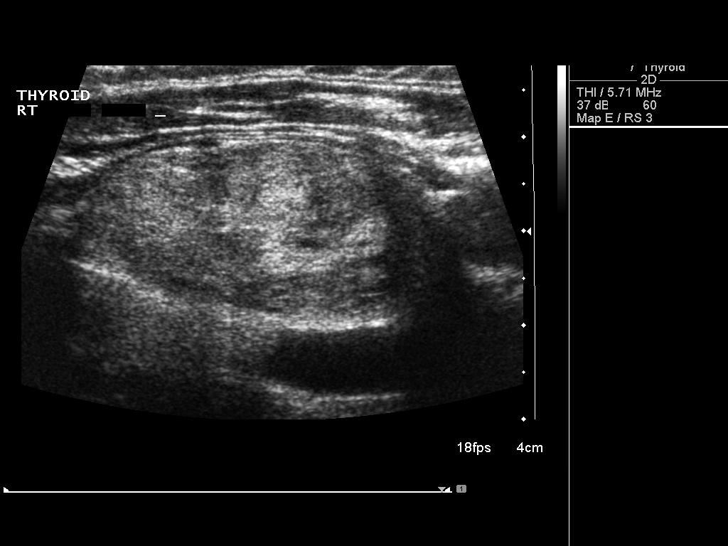
[im 3/21]
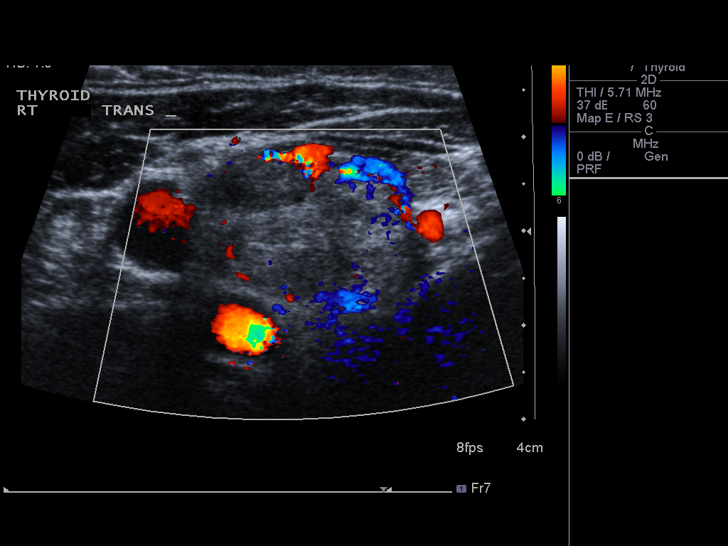
[im 4/21]
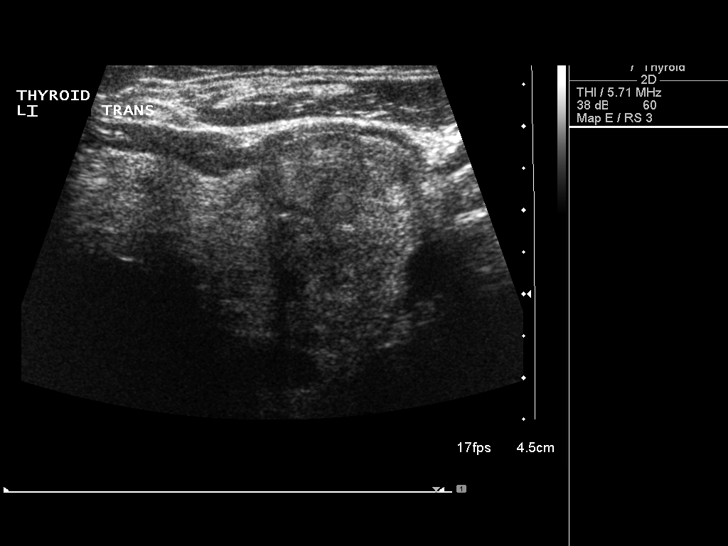
[im 6/21]
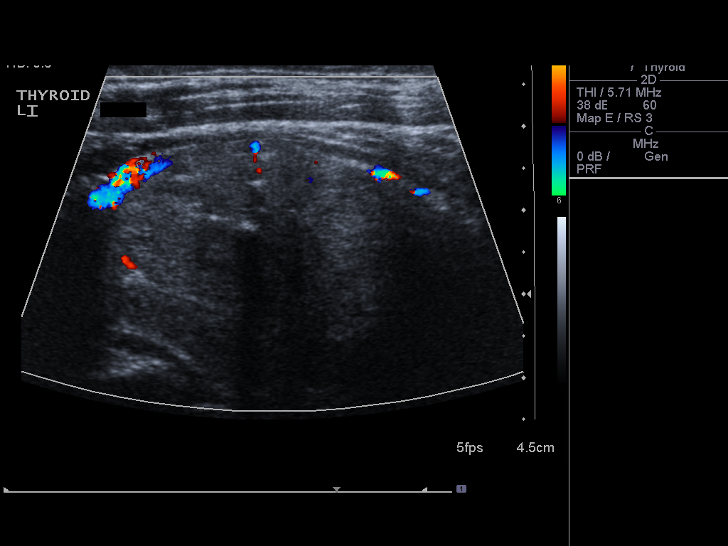
[im 7/21]
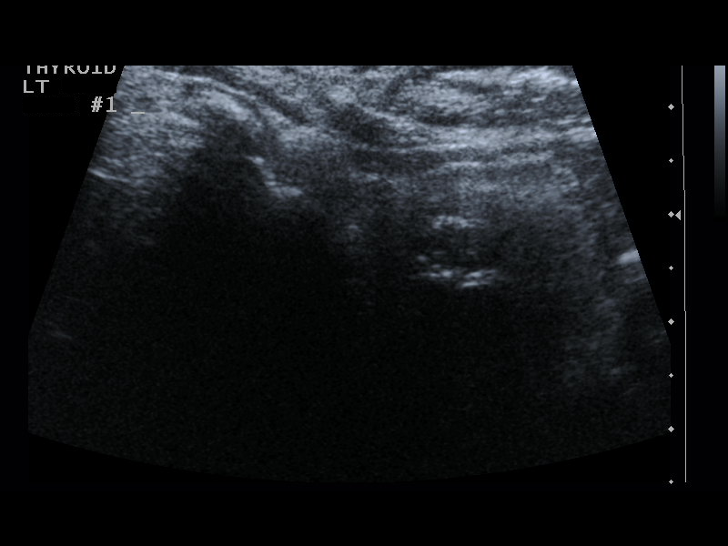
[im 9/21]
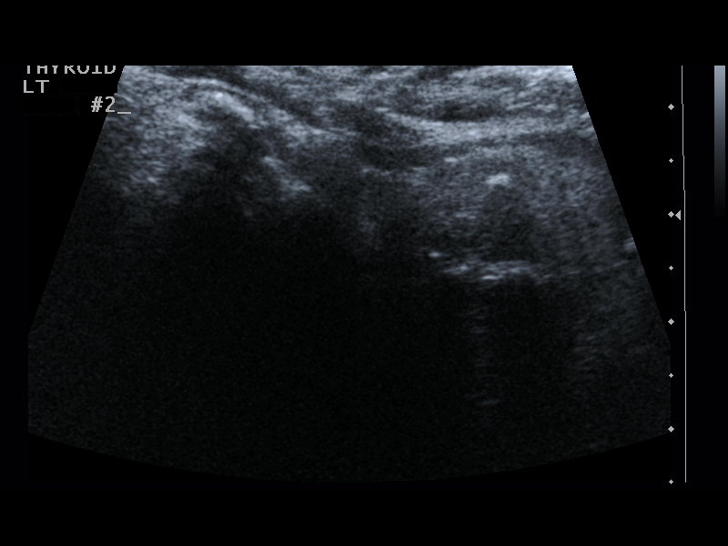
[im 10/21]
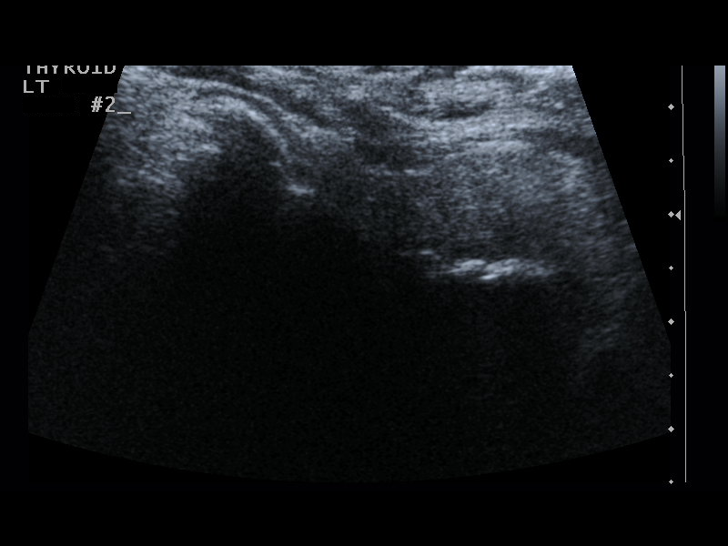
[im 12/21]
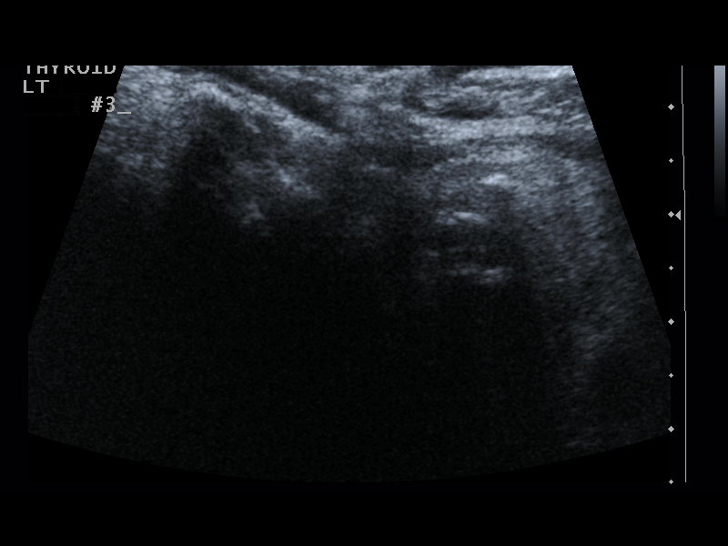
[im 13/21]
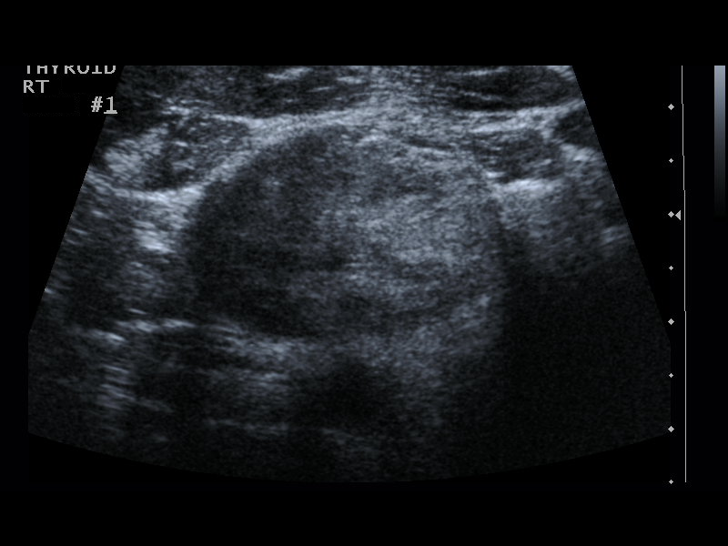
[im 15/21]
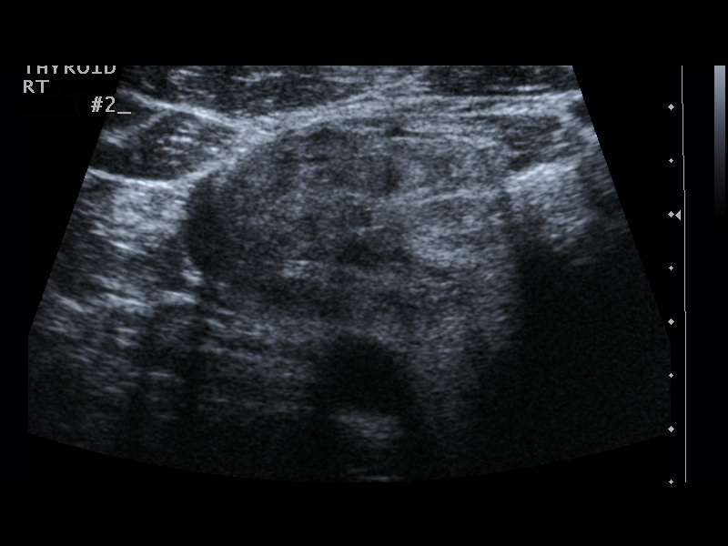
[im 16/21]
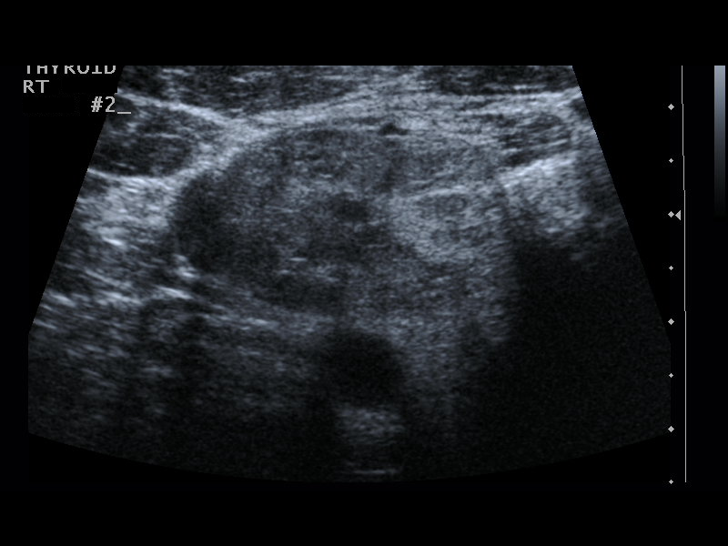
[im 18/21]
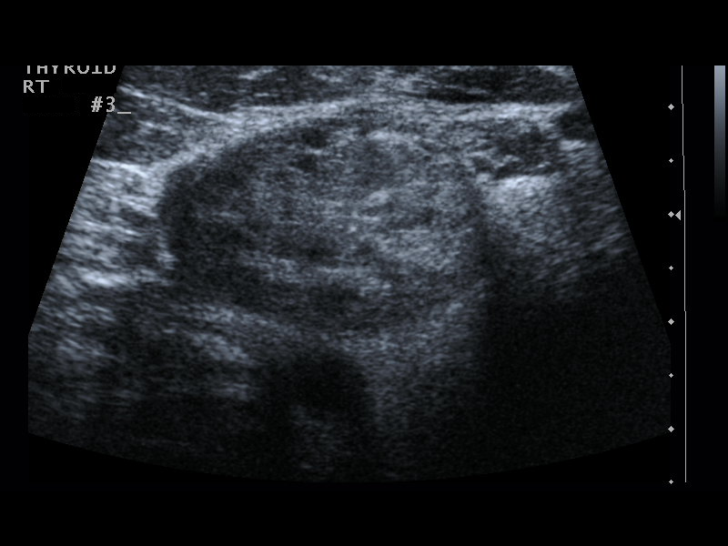
[im 19/21]
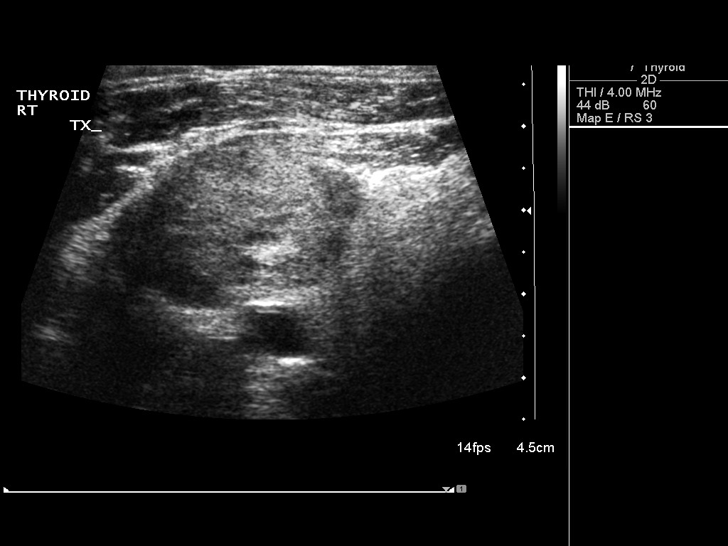
[im 21/21]
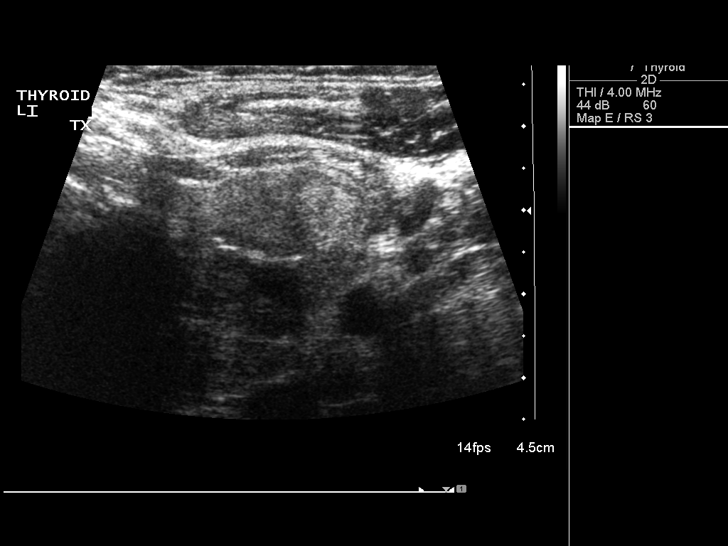

[14 of 21 positions shown; findings below may reference images not displayed]

PROCEDURE:
Thyroid biopsy was thoroughly discussed with the patient and
questions were answered. The benefits, risks, alternatives, and
complications were also discussed. The patient understands and
wishes to proceed with the procedure. Written consent was obtained.

Ultrasound was performed to localize and mark an adequate site for
the biopsy. The patient was then prepped and draped in a normal
sterile fashion. Local anesthesia was provided with 1% lidocaine.
Using direct ultrasound guidance, 3 passes were made using needles
into the nodule within the right lobe of the thyroid. Subsequently,
3 passes were made using needles into the nodule within the left
lobe of the thyroid. Ultrasound was used to confirm needle
placements on all occasions. Specimens were sent to Pathology for
analysis.

Complications:  None.
FINDINGS: Images document needle placement in the bilateral dominant thyroid
nodules.
IMPRESSION: Ultrasound guided needle aspirate biopsy performed of the bilateral
dominant thyroid nodules.

## 2015-06-27 ENCOUNTER — Ambulatory Visit (INDEPENDENT_AMBULATORY_CARE_PROVIDER_SITE_OTHER): Payer: BLUE CROSS/BLUE SHIELD | Admitting: Internal Medicine

## 2015-06-27 ENCOUNTER — Encounter: Payer: Self-pay | Admitting: Internal Medicine

## 2015-06-27 VITALS — BP 114/78 | HR 45 | Ht 70.5 in | Wt 251.0 lb

## 2015-06-27 DIAGNOSIS — D440 Neoplasm of uncertain behavior of thyroid gland: Secondary | ICD-10-CM | POA: Diagnosis not present

## 2015-06-27 DIAGNOSIS — G4733 Obstructive sleep apnea (adult) (pediatric): Secondary | ICD-10-CM

## 2015-06-27 NOTE — Patient Instructions (Signed)
We can continue CPAP auto 5-20/[facility]. Please call if there are questions.

## 2015-06-27 NOTE — Assessment & Plan Note (Signed)
Great compliance and control. We agreed to leave pressure settings on AutoPap 5-20

## 2015-06-27 NOTE — Assessment & Plan Note (Signed)
Surgical resection by Dr. Harlow Asa No impact of thyroid therapy on airway to conflict with OSA management at this point

## 2015-06-27 NOTE — Progress Notes (Signed)
Subjective:    Patient ID: Luis Kane., male    DOB: [birth year], 64 y.o.   MRN: [id number]  HPI   05/28/14- Dr Gwenette Greet The patient comes in today for follow-up of his obstructive sleep apnea. He is wearing C Pap compliantly, and is having no issues with his mask fit or pressure. His download shows excellent compliance, and good control of his AHI.  06/27/2015-64 year old male never smoker followed for OSA, complicated by history thyroid cancer, CAD, HBP CPAP auto 5-20/[facility] FOLLOWS FOR: former [facility] pt; DME is [facility]-wears CPAP every night; not due for any new supplies at this time. He does not recognize pressure ever to high or too low. Download confirms great compliance and control used all night every night set at [employer]. He otherwise feels well with no concerns after thyroidectomy this spring.  ROS-see HPI   Negative unless "+" Constitutional:    weight loss, night sweats, fevers, chills, fatigue, lassitude. HEENT:    headaches, difficulty swallowing, tooth/dental problems, sore throat,       sneezing, itching, ear ache, nasal congestion, post nasal drip, snoring CV:    chest pain, orthopnea, PND, swelling in lower extremities, anasarca,                                                   dizziness, palpitations Resp:   shortness of breath with exertion or at rest.                productive cough,   non-productive cough, coughing up of blood.              change in color of mucus.  wheezing.   Skin:    rash or lesions. GI:  No-   heartburn, indigestion, abdominal pain, nausea, vomiting,  GU:  MS:   joint pain, stiffness, decreased range of motion, back pain. Neuro-     nothing unusual Psych:  change in mood or affect.  depression or anxiety.   memory loss.     bjective:  OBJ- Physical Exam General- Alert, Oriented, Affect-appropriate, Distress- none acute, + overweight Skin- rash-none, lesions- none, excoriation- none Lymphadenopathy- none Head- atraumatic            Eyes-  Gross vision intact, PERRLA, conjunctivae and secretions clear            Ears- Hearing, canals-normal            Nose- Clear, no-Septal dev, mucus, polyps, erosion, perforation             Throat- Mallampati III , mucosa clear , drainage- none, tonsils- atrophic Neck- flexible , trachea midline, no stridor , thyroid nl, carotid no bruit Chest - symmetrical excursion , unlabored           Heart/CV- RRR , no murmur , no gallop  , no rub, nl s1 s2                           - JVD- none , edema- none, stasis changes- none, varices- none           Lung- clear to P&A, wheeze- none, cough- none , dullness-none, rub- none           Chest wall-  Abd-  Br/ Gen/ Rectal- Not done, not indicated Extrem-  cyanosis- none, clubbing, none, atrophy- none, strength- nl Neuro- grossly intact to observation    Assessment & Plan:

## 2016-06-01 ENCOUNTER — Telehealth: Payer: Self-pay | Admitting: Internal Medicine

## 2016-06-01 NOTE — Telephone Encounter (Signed)
Called and spoke with pt and he stated that he has moved to [place], [place] and he would like for CY to do a referral to a new Pulmonary doctor in that area.  Pt stated that he has no idea of anyone in this area.  CY please advise. thanks

## 2016-06-01 NOTE — Telephone Encounter (Signed)
Called and lmom to make the pt aware of CY recs. Pt advised to call back if anything further is needed.

## 2016-06-01 NOTE — Telephone Encounter (Signed)
I no longer know the doctors in the [place] area. What I usually suggest is that he establish with a primary doctor who knows the local doctors. He can also check with his insurance for their list of participating providers there.

## 2016-06-26 ENCOUNTER — Ambulatory Visit: Payer: BLUE CROSS/BLUE SHIELD | Admitting: Internal Medicine

## 2022-04-06 ENCOUNTER — Ambulatory Visit
Admission: RE | Admit: 2022-04-06 | Discharge: 2022-04-06 | Disposition: A | Discharge status: Home / Self Care | Payer: Medicare Other | Source: Ambulatory Visit | Attending: Family Medicine | Admitting: Family Medicine

## 2022-04-06 ENCOUNTER — Other Ambulatory Visit: Payer: Self-pay | Admitting: Family Medicine

## 2022-04-06 DIAGNOSIS — M546 Pain in thoracic spine: Secondary | ICD-10-CM
